# Patient Record
Sex: Female | Born: 1990 | Race: Black or African American | Hispanic: No | Marital: Single | Smoking: Never smoker
Health system: Southern US, Community
[De-identification: ages and names within clinical notes are randomized; demographics above are authoritative.]

## PROBLEM LIST (undated history)

## (undated) ENCOUNTER — Inpatient Hospital Stay (HOSPITAL_COMMUNITY): Payer: Self-pay

## (undated) DIAGNOSIS — M329 Systemic lupus erythematosus, unspecified: Secondary | ICD-10-CM

## (undated) DIAGNOSIS — L509 Urticaria, unspecified: Secondary | ICD-10-CM

## (undated) DIAGNOSIS — T7840XA Allergy, unspecified, initial encounter: Secondary | ICD-10-CM

## (undated) DIAGNOSIS — D649 Anemia, unspecified: Secondary | ICD-10-CM

## (undated) DIAGNOSIS — A6000 Herpesviral infection of urogenital system, unspecified: Secondary | ICD-10-CM

## (undated) DIAGNOSIS — Z8619 Personal history of other infectious and parasitic diseases: Secondary | ICD-10-CM

## (undated) HISTORY — DX: Systemic lupus erythematosus, unspecified: M32.9

## (undated) HISTORY — PX: NO PAST SURGERIES: SHX2092

---
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
## 2011-03-24 ENCOUNTER — Inpatient Hospital Stay (INDEPENDENT_AMBULATORY_CARE_PROVIDER_SITE_OTHER)
Admission: RE | Admit: 2011-03-24 | Discharge: 2011-03-24 | Disposition: A | Discharge status: Home / Self Care | Payer: Medicaid Other | Source: Ambulatory Visit | Attending: Family Medicine | Admitting: Family Medicine

## 2011-03-24 DIAGNOSIS — R197 Diarrhea, unspecified: Secondary | ICD-10-CM

## 2011-09-16 ENCOUNTER — Encounter (HOSPITAL_COMMUNITY): Payer: Self-pay | Admitting: Cardiology

## 2011-09-16 ENCOUNTER — Emergency Department (INDEPENDENT_AMBULATORY_CARE_PROVIDER_SITE_OTHER)
Admission: EM | Admit: 2011-09-16 | Discharge: 2011-09-16 | Disposition: A | Discharge status: Home / Self Care | Payer: Medicaid Other | Source: Home / Self Care | Attending: Emergency Medicine | Admitting: Emergency Medicine

## 2011-09-16 DIAGNOSIS — L508 Other urticaria: Secondary | ICD-10-CM

## 2011-09-16 MED ORDER — DIPHENHYDRAMINE HCL 25 MG PO CAPS
ORAL_CAPSULE | ORAL | Status: AC
  Filled 2011-09-16: qty 1

## 2011-09-16 MED ORDER — FEXOFENADINE HCL 180 MG PO TABS: 180.0000 mg | ORAL_TABLET | Freq: Every day | ORAL | Status: DC

## 2011-09-16 MED ORDER — METHYLPREDNISOLONE ACETATE 80 MG/ML IJ SUSP
INTRAMUSCULAR | Status: AC
  Filled 2011-09-16: qty 1

## 2011-09-16 MED ORDER — CIMETIDINE 400 MG PO TABS: 400.0000 mg | ORAL_TABLET | Freq: Two times a day (BID) | ORAL | Status: DC

## 2011-09-16 MED ORDER — DIPHENHYDRAMINE HCL 25 MG PO CAPS
25.0000 mg | ORAL_CAPSULE | Freq: Four times a day (QID) | ORAL | Status: DC | PRN
  Administered 2011-09-16: 25 mg via ORAL

## 2011-09-16 MED ORDER — METHYLPREDNISOLONE ACETATE 80 MG/ML IJ SUSP
80.0000 mg | Freq: Once | INTRAMUSCULAR | Status: AC
  Administered 2011-09-16: 80 mg via INTRAMUSCULAR

## 2011-09-16 MED ORDER — PREDNISONE 10 MG PO TABS: ORAL_TABLET | ORAL | Status: DC

## 2011-09-16 NOTE — ED Notes (Signed)
Pt reports chest pain for the past week. She hives appear today to face, abdomen and buttocks that lasted several hours today. The have gone away. The chest pain at this time 2/10 is not as strong as it was earlier today 8/10. Describes to be uncomfortable and causes shortness of breath. She has had a similar episode of hives 1 year ago. Denies fever. Tolerating PO intake

## 2011-09-16 NOTE — Discharge Instructions (Signed)
Hives Hives (urticaria) are itchy, red, swollen patches on the skin. They may change size, shape, and location quickly and repeatedly. Hives that occur deeper in the skin can cause swelling of the hands, feet, and face. Hives may be an allergic reaction to something you or your child ate, touched, or put on the skin. Hives can also be a reaction to cold, heat, viral infections, medication, insect bites, or emotional stress. Often the cause is hard to find. Hives can come and go for several days to several weeks. Hives are not contagious. HOME CARE INSTRUCTIONS   If the cause of the hives is known, avoid exposure to that source.   To relieve itching and rash:   Apply cold compresses to the skin or take cool water baths. Do not take or give your child hot baths or showers because the warmth will make the itching worse.   The best medicine for hives is an antihistamine. An antihistamine will not cure hives, but it will reduce their severity. You can use an antihistamine available over the counter. This medicine may make your child sleepy. Teenagers should not drive while using this medicine.   Take or give an antihistamine every 6 hours until the hives are completely gone for 24 hours or as directed.   Your child may have other medications prescribed for itching. Give these as directed by your child's caregiver.   You or your child should wear loose fitting clothing, including undergarments. Skin irritations may make hives worse.   Follow-up as directed by your caregiver.  SEEK MEDICAL CARE IF:   You or your child still have considerable itching after taking the medication (prescribed or purchased over the counter).   Joint swelling or pain occurs.  SEEK IMMEDIATE MEDICAL CARE IF:   You have a fever.   Swollen lips or tongue are noticed.   There is difficulty with breathing, swallowing, or tightness in the throat or chest.   Abdominal pain develops.   Your child starts acting very  sick.  These may be the first signs of a life-threatening allergic reaction. THIS IS AN EMERGENCY. Call 911 for medical help. MAKE SURE YOU:   Understand these instructions.   Will watch your condition.   Will get help right away if you are not doing well or get worse.  Document Released: 05/20/2005 Document Revised: 05/09/2011 Document Reviewed: 01/08/2008 [employer] Patient Information 2012 [employer]. 

## 2011-09-16 NOTE — ED Provider Notes (Signed)
Chief Complaint  Patient presents with  . Urticaria  . Chest Pain    History of Present Illness:   The patient is a 21 year old female who has had a two-year history of intermittent urticarial rash. When this first began she underwent extensive testing but wasn't found to be allergic to anything in particular. She was treated with antihistamines and steroids and got better. Current episode started yesterday. She had itchy lesions on her stomach, around her eyes, her back, and her buttocks. They come and go and another mostly gone except she has a couple spots on her buttocks. They're also associated with chest pain. She's had pain in her upper sternal area and right submammary area. She's had this before. It feels like a throbbing pain. She denies any wheezing, tightness in the chest, or difficulty breathing. No palpitations, dizziness, or syncope. No abdominal pain, nausea, vomiting.  Review of Systems:  Other than noted above, the patient denies any of the following symptoms: Systemic:  No fever, chills, sweats, weight loss, or fatigue. ENT:  No nasal congestion, rhinorrhea, sore throat, swelling of lips, tongue or throat. Resp:  No cough, wheezing, or shortness of breath. Skin:  No rash, itching, nodules, or suspicious lesions.  PMFSH:  Past medical history, family history, social history, meds, and allergies were reviewed.  Physical Exam:   Vital signs:  BP 126/81  Pulse 98  Temp(Src) 98.2 F (36.8 C) (Oral)  Resp 16  SpO2 100%  LMP 08/18/2011 Gen:  Alert, oriented, in no distress. ENT: Pharynx is clear, no intraoral lesions. Lungs: Auscultation without wheezes, rales, rhonchi. Heart: Regular rhythm, no gallops, murmurs, or rubs. Abdomen: Soft, nontender, no organomegaly or mass. Skin:  Her skin was mostly clear. She had a couple of small bumps on her buttocks.  Course in Urgent Care Center:   She was given Benadryl 25 mg by mouth and Depo-Medrol 80 mg IM and tolerated these  medications well without any immediate side effects.  Assessment:  The encounter diagnosis was Chronic urticaria.  Plan:   1.  The following meds were prescribed:   New Prescriptions   CIMETIDINE (TAGAMET) 400 MG TABLET    Take 1 tablet (400 mg total) by mouth 2 (two) times daily.   FEXOFENADINE (ALLEGRA) 180 MG TABLET    Take 1 tablet (180 mg total) by mouth daily.   PREDNISONE (DELTASONE) 10 MG TABLET    Take 4 tabs daily for 4 days, 3 tabs daily for 4 days, 2 tabs daily for 4 days, then 1 tab daily for 4 days.   2.  The patient was instructed in symptomatic care and handouts were given. 3.  The patient was told to return if becoming worse in any way, if no better in 3 or 4 days, and given some red flag symptoms that would indicate earlier return.  Follow up:  The patient was told to follow up she was told to followup with a primary care doctor or dermatologist as soon as possible.      Reuben Likes, MD 09/16/11 [phone]

## 2011-10-20 ENCOUNTER — Encounter (HOSPITAL_COMMUNITY): Payer: Self-pay | Admitting: *Deleted

## 2011-10-20 ENCOUNTER — Emergency Department (INDEPENDENT_AMBULATORY_CARE_PROVIDER_SITE_OTHER)
Admission: EM | Admit: 2011-10-20 | Discharge: 2011-10-20 | Disposition: A | Discharge status: Home / Self Care | Payer: Medicaid Other | Source: Home / Self Care | Attending: Emergency Medicine | Admitting: Emergency Medicine

## 2011-10-20 DIAGNOSIS — J069 Acute upper respiratory infection, unspecified: Secondary | ICD-10-CM

## 2011-10-20 DIAGNOSIS — L508 Other urticaria: Secondary | ICD-10-CM

## 2011-10-20 HISTORY — DX: Allergy, unspecified, initial encounter: T78.40XA

## 2011-10-20 HISTORY — DX: Urticaria, unspecified: L50.9

## 2011-10-20 MED ORDER — METHYLPREDNISOLONE ACETATE 80 MG/ML IJ SUSP
80.0000 mg | Freq: Once | INTRAMUSCULAR | Status: AC
  Administered 2011-10-20: 80 mg via INTRAMUSCULAR

## 2011-10-20 MED ORDER — METHYLPREDNISOLONE ACETATE 80 MG/ML IJ SUSP
INTRAMUSCULAR | Status: AC
  Filled 2011-10-20: qty 1

## 2011-10-20 MED ORDER — FEXOFENADINE HCL 180 MG PO TABS: 180.0000 mg | ORAL_TABLET | Freq: Every day | ORAL | Status: DC

## 2011-10-20 MED ORDER — DIPHENHYDRAMINE HCL 25 MG PO CAPS
ORAL_CAPSULE | ORAL | Status: AC
  Filled 2011-10-20: qty 1

## 2011-10-20 MED ORDER — PREDNISONE 10 MG PO TABS: ORAL_TABLET | ORAL | Status: DC

## 2011-10-20 MED ORDER — CIMETIDINE 400 MG PO TABS: 400.0000 mg | ORAL_TABLET | Freq: Two times a day (BID) | ORAL | Status: DC

## 2011-10-20 MED ORDER — DIPHENHYDRAMINE HCL 25 MG PO CAPS
25.0000 mg | ORAL_CAPSULE | Freq: Once | ORAL | Status: AC
  Administered 2011-10-20: 25 mg via ORAL

## 2011-10-20 NOTE — ED Provider Notes (Signed)
Chief Complaint  Patient presents with  . Rash  . Facial Swelling    History of Present Illness:   The patient is a 21 year old female with chronic urticaria going on for the past 2 years. She had allergy testing when this first began. She was here month ago with the same thing. There is nothing specific triggers off her urticarial symptoms. She's had intermittent symptoms off and on since she was here month ago. The antihistamines that she took seem to help, but then she ran out of them. Current  episode has been going on for the past day. She notes swelling of the face and rash on the upper chest. She notes a throbbing sensation but denies any pain or itching. She's not had any trouble breathing. Over the past 3 days she's had some mild URI symptoms including sore throat, nasal congestion with clear drainage, cough productive green sputum, and fever.  Review of Systems:  Other than noted above, the patient denies any of the following symptoms: Systemic:  No fever, chills, sweats, weight loss, or fatigue. ENT:  No nasal congestion, rhinorrhea, sore throat, swelling of lips, tongue or throat. Resp:  No cough, wheezing, or shortness of breath. Skin:  No rash, itching, nodules, or suspicious lesions.  PMFSH:  Past medical history, family history, social history, meds, and allergies were reviewed.  Physical Exam:   Vital signs:  BP 130/82  Pulse 103  Temp(Src) 100.7 F (38.2 C) (Oral)  Resp 18  SpO2 98%  LMP 09/22/2011 Gen:  Alert, oriented, in no distress. ENT:  Pharynx clear, no intraoral lesions, moist mucous membranes. Lungs:  Clear to auscultation. Skin:  There's not much to see in her face today. Her lips maybe a little bit swelled, she has some bumps on her face but this may be more acne that is any kind of allergic rash. I can see anything on her chest. Her skin is otherwise clear.  Course in Urgent Care Center:   She was given Depo-Medrol 80 mg IM and Benadryl 25 mg by mouth and  tolerated both of these meds well without any immediate side effects.  Assessment:  The primary encounter diagnosis was Chronic urticaria. A diagnosis of Viral upper respiratory infection was also pertinent to this visit.  Plan:   1.  The following meds were prescribed:   New Prescriptions   CIMETIDINE (TAGAMET) 400 MG TABLET    Take 1 tablet (400 mg total) by mouth 2 (two) times daily.   FEXOFENADINE (ALLEGRA) 180 MG TABLET    Take 1 tablet (180 mg total) by mouth daily.   PREDNISONE (DELTASONE) 10 MG TABLET    Take 4 tabs daily for 4 days, 3 tabs daily for 4 days, 2 tabs daily for 4 days, then 1 tab daily for 4 days.   2.  The patient was instructed in symptomatic care and handouts were given. 3.  The patient was told to return if becoming worse in any way, if no better in 3 or 4 days, and given some red flag symptoms that would indicate earlier return.  Follow up:  The patient was told to follow up with Dr. [place] Callas in a week for allergy followup.      Reuben Likes, MD 10/20/11 2015

## 2011-10-20 NOTE — Discharge Instructions (Signed)
Most upper respiratory infections are caused by viruses and do not require antibiotics.  We try to save the antibiotics for when we really need them to avoid resistance.  This does not mean that there is nothing that can be done.  Here are a few hints about things that can be done at home to get over an upper respiratory infection quicker:  Get extra sleep and extra fluids.  Get 7 to 9 hours of sleep per night and 6 to 8 glasses of water a day.  Getting extra sleep keeps the immune system from getting run down.  Most people with an upper respiratory infection are a little dehydrated.  The extra fluids also keep the secretions liquified and easier to deal with.  Also, get extra vitamin C.  4000 mg per day is the recommended dose. For the aches, headache, and fever, acetaminophen or ibuprofen are helpful.  These can be alternated every 4 hours.  People with liver disease should avoid large amounts of acetaminophen, and people with ulcer disease, gastroesophageal reflux, gastritis, congestive heart failure, chronic kidney disease, coronary artery disease and the elderly should avoid ibuprofen. For nasal congestion try Mucinex-D, or if you're having lots of sneezing or copious clear nasal drainage Allegra-D-24 hour.  A Saline nasal spray such as Ocean Spray can also help as can decongestant sprays such as Afrin, but you should not use the decongestant sprays for more than 3 or 4 days since they can be habituating.  If nasal dryness is a problem, Ayr Nasal Gel can help moisturize your nasal passages.  Breath Rite nasal strips can also offer a non-drug alternative treatment to nasal congestion, especially at night. For people with symptoms of sinusitis, sleeping with your head elevated can be helpful.  For sinus pain, moist, hot compresses to the face may provide some relief.  Many people find that inhaling steam as in a shower or from a pot of steaming water can help. For sore throat, zinc containing lozenges such  as Cold-Eze or Zicam are helpful.  Zinc helps to fight infection and has a mild astringent effect that relieves the sore, achey throat.  Hot salt water gargles (8 oz of hot water, 1/2 tsp of table salt, and a pinch of baking soda) can give relief as well as hot beverages such as hot tea. For the cough, old time remedies such as honey or honey and lemon are tried and true.  Over the counter cough syrups such as Delsym 2 tsp every 12 hours can help as well.  It has also been found recently that Aleve can help control a cough.  The dose is 1 to 2 tablets twice daily with food.  This can be combined with Delsym. (Note, if you are taking ibuprofen, you should not take Aleve as well--take one or the other.)  It's important when you have an upper respiratory infection not to pass the infection to others.  This involves being very careful about the following:  Frequent hand washing or use of hand sanitizer, especially after coughing, sneezing, blowing your nose or touching your face, nose or eyes. Do not shake hands or touch anyone and try to avoid touching surfaces that other people use such as doorknobs, shopping carts, telephones and computer keyboards. Use tissues and dispose of them properly in a garbage can or ziplock bag. Cough into your sleeve. Do not let others eat or drink after you.  It's also important to recognize the signs of serious illness and   get evaluated if they occur: Any respiratory infection that lasts more than 7 to 10 days.  Yellow nasal drainage and sputum are not reliable indicators of a bacterial infection, but if they last for more than 1 week, see your doctor. Fever and sore throat can indicate strep. Fever and cough can indicate influenza or pneumonia. Any kind of severe symptom such as difficulty breathing, intractable vomiting, or severe pain should prompt you to see a doctor as soon as possible.   Your body's immune system is really the thing that will get rid of this  infection.  Your immune system is comprised of 2 types of specialized cells called T cells and B cells.  T cells coordinate the array of cells in your body that engulf invading bacteria or viruses while B cells orchestrate the production of antibodies that neutralize infection.  Anything we do or any medications we give you, will just strengthen your immune system or help it clear up the infection quicker.  Here are a few helpful hints to improve your immune system to help overcome this illness or to prevent future infections:  A few vitamins can improve the health of your immune system.  That's why your diet should include plenty of fruits, vegetables, fish, nuts, and whole grains.  Vitamin A and bet-carotene can increase the cells that fight infections (T cells and B cells).  Vitamin A is abundant in dark greens and orange vegetables such as spinach, greens, sweet potatoes, and carrots.  Vitamin B6 contributes to the maturation of white blood cells, the cells that fight disease.  Foods with vitamin B6 include cold cereal and bananas.  Vitamin C is credited with preventing colds because it increases white blood cells and also prevents cellular damage.  Citrus fruits, peaches and green and red bell peppers are all hight in vitamin C.  Vitamin E is an anti-oxidant that encourages the production of natural killer cells which reject foreign invaders and B cells that produce antibodies.  Foods high in vitamin E include wheat germ, nuts and seeds.  Foods high in omega-3 fatty acids found in foods like salmon, tuna and mackerel boost your immune system and help cells to engulf and absorb germs.  Probiotics are good bacteria that increase your T cells.  These can be found in yogurt and are available in supplements such as Culturelle or Align.  Moderate exercise increases the strength of your immune system and your ability to recover from illness.  I suggest 3 to 5 moderate intensity 30 minute workouts per  week.    Sleep is another component of maintaining a strong immune system.  It enables your body to recuperate from the day's activities, stress and work.  My recommendation is to get between 7 and 9 hours of sleep per night.  If you smoke, try to quit completely or at least cut down.  Drink alcohol only in moderation if at all.  No more than 2 drinks daily for men or 1 for women.  Get a flu vaccine early in the fall or if you have not gotten one yet, once this illness has run its course.  If you are over 65, a smoker, or an asthmatic, get a pneumococcal vaccine.  My final recommendation is to maintain a healthy weight.  Excess weight can impair the immune system by interfering with the way the immune system deals with invading viruses or bacteria. Hives Hives (urticaria) are itchy, red, swollen patches on the skin. They may change  size, shape, and location quickly and repeatedly. Hives that occur deeper in the skin can cause swelling of the hands, feet, and face. Hives may be an allergic reaction to something you or your child ate, touched, or put on the skin. Hives can also be a reaction to cold, heat, viral infections, medication, insect bites, or emotional stress. Often the cause is hard to find. Hives can come and go for several days to several weeks. Hives are not contagious. HOME CARE INSTRUCTIONS  If the cause of the hives is known, avoid exposure to that source.  To relieve itching and rash:  Apply cold compresses to the skin or take cool water baths. Do not take or give your child hot baths or showers because the warmth will make the itching worse.  The best medicine for hives is an antihistamine. An antihistamine will not cure hives, but it will reduce their severity. You can use an antihistamine available over the counter. This medicine may make your child sleepy. Teenagers should not drive while using this medicine.  Take or give an antihistamine every 6 hours until the hives are  completely gone for 24 hours or as directed.  Your child may have other medications prescribed for itching. Give these as directed by your child's caregiver.  You or your child should wear loose fitting clothing, including undergarments. Skin irritations may make hives worse.  Follow-up as directed by your caregiver.  SEEK MEDICAL CARE IF:  You or your child still have considerable itching after taking the medication (prescribed or purchased over the counter).  Joint swelling or pain occurs.  SEEK IMMEDIATE MEDICAL CARE IF:  You have a fever.  Swollen lips or tongue are noticed.  There is difficulty with breathing, swallowing, or tightness in the throat or chest.  Abdominal pain develops.  Your child starts acting very sick.  These may be the first signs of a life-threatening allergic reaction. THIS IS AN EMERGENCY. Call 911 for medical help. MAKE SURE YOU:  Understand these instructions.  Will watch your condition.  Will get help right away if you are not doing well or get worse.  Document Released: 05/20/2005 Document Revised: 05/09/2011 Document Reviewed: 01/08/2008 [facility] Patient Information 2012 [place], [place].

## 2011-10-20 NOTE — ED Notes (Signed)
Pt with onset of intermittent rash and swelling face /lips/eyes this morning pt works 7pm to 7am - pt had same type symptoms in April - per pt swelling getting progressively worse -

## 2012-04-30 ENCOUNTER — Encounter (HOSPITAL_COMMUNITY): Payer: Self-pay | Admitting: *Deleted

## 2012-04-30 ENCOUNTER — Emergency Department (HOSPITAL_COMMUNITY)
Admission: EM | Admit: 2012-04-30 | Discharge: 2012-04-30 | Disposition: A | Discharge status: Home / Self Care | Payer: BC Managed Care – PPO | Attending: Emergency Medicine | Admitting: Emergency Medicine

## 2012-04-30 DIAGNOSIS — Z9189 Other specified personal risk factors, not elsewhere classified: Secondary | ICD-10-CM | POA: Insufficient documentation

## 2012-04-30 DIAGNOSIS — G43909 Migraine, unspecified, not intractable, without status migrainosus: Secondary | ICD-10-CM | POA: Insufficient documentation

## 2012-04-30 DIAGNOSIS — Z872 Personal history of diseases of the skin and subcutaneous tissue: Secondary | ICD-10-CM | POA: Insufficient documentation

## 2012-04-30 DIAGNOSIS — Z3202 Encounter for pregnancy test, result negative: Secondary | ICD-10-CM | POA: Insufficient documentation

## 2012-04-30 DIAGNOSIS — R110 Nausea: Secondary | ICD-10-CM | POA: Insufficient documentation

## 2012-04-30 MED ORDER — SODIUM CHLORIDE 0.9 % IV BOLUS (SEPSIS)
1000.0000 mL | Freq: Once | INTRAVENOUS | Status: AC
  Administered 2012-04-30: 1000 mL via INTRAVENOUS

## 2012-04-30 MED ORDER — DEXAMETHASONE SODIUM PHOSPHATE 10 MG/ML IJ SOLN
10.0000 mg | Freq: Once | INTRAMUSCULAR | Status: AC
  Administered 2012-04-30: 10 mg via INTRAVENOUS
  Filled 2012-04-30: qty 1

## 2012-04-30 MED ORDER — DIPHENHYDRAMINE HCL 50 MG/ML IJ SOLN
25.0000 mg | Freq: Once | INTRAMUSCULAR | Status: AC
  Administered 2012-04-30: 25 mg via INTRAVENOUS
  Filled 2012-04-30: qty 1

## 2012-04-30 MED ORDER — KETOROLAC TROMETHAMINE 30 MG/ML IJ SOLN
30.0000 mg | Freq: Once | INTRAMUSCULAR | Status: AC
  Administered 2012-04-30: 30 mg via INTRAVENOUS
  Filled 2012-04-30: qty 1

## 2012-04-30 MED ORDER — METOCLOPRAMIDE HCL 5 MG/ML IJ SOLN
10.0000 mg | Freq: Once | INTRAMUSCULAR | Status: AC
  Administered 2012-04-30: 10 mg via INTRAVENOUS
  Filled 2012-04-30: qty 2

## 2012-04-30 NOTE — ED Notes (Signed)
Headache for one week.  Hives for 3 years .

## 2012-04-30 NOTE — ED Provider Notes (Signed)
History     CSN: [id number]  Arrival date & time 04/30/12  2016   First MD Initiated Contact with Patient 04/30/12 2023      Chief Complaint  Patient presents with  . Headache    (Consider location/radiation/quality/duration/timing/severity/associated sxs/prior treatment) Patient is a 21 y.o. female presenting with headaches.  Headache  This is a new problem. The current episode started more than 2 days ago. The problem occurs every few hours. The problem has not changed since onset.The headache is associated with nothing. The pain is located in the right unilateral region. The quality of the pain is described as throbbing. The pain is at a severity of 7/10. The pain is moderate. The pain does not radiate. Associated symptoms include nausea. Pertinent negatives include no fever, no chest pressure, no near-syncope, no shortness of breath and no vomiting. She has tried nothing for the symptoms.    Past Medical History  Diagnosis Date  . Hives   . Allergic reaction     Past Surgical History  Procedure Date  . Cesarean section 08/07/2001    Family History  Problem Relation Age of Onset  . Hypertension Father     History  Substance Use Topics  . Smoking status: Never Smoker   . Smokeless tobacco: Not on file  . Alcohol Use: Yes     Comment: occas    OB History    Grav Para Term Preterm Abortions TAB SAB Ect Mult Living                  Review of Systems  Constitutional: Negative for fever.  Respiratory: Negative for shortness of breath.   Cardiovascular: Negative for near-syncope.  Gastrointestinal: Positive for nausea. Negative for vomiting.  Neurological: Positive for headaches.  All other systems reviewed and are negative.    Allergies  Hydrocodone  Home Medications  No current outpatient prescriptions on file.  BP 148/85  Pulse 104  Temp 98.3 F (36.8 C) (Oral)  Resp 18  SpO2 100%  Physical Exam  Nursing note and vitals  reviewed. Constitutional: She is oriented to person, place, and time. She appears well-developed and well-nourished. No distress.  HENT:  Head: Normocephalic and atraumatic.  Eyes: EOM are normal. Pupils are equal, round, and reactive to light.  Neck: Normal range of motion. Neck supple.  Cardiovascular: Normal rate and regular rhythm.  Exam reveals no friction rub.   No murmur heard. Pulmonary/Chest: Effort normal and breath sounds normal. No respiratory distress. She has no wheezes. She has no rales.  Abdominal: Soft. She exhibits no distension. There is no tenderness. There is no rebound.  Musculoskeletal: Normal range of motion. She exhibits no edema.  Neurological: She is alert and oriented to person, place, and time. No cranial nerve deficit. She exhibits normal muscle tone. Coordination normal.       Normal gait.  Skin: She is not diaphoretic.    ED Course  Procedures (including critical care time)   Labs Reviewed  POCT PREGNANCY, URINE   No results found.   1. Migraine       MDM   Patient is a 22 year old female who presents with headaches. Started 3 days ago. Right-sided, throbbing. Some associated nausea and photophobia. It is improving. Gradual onset, no thunderclap. Denies fevers. Here patient is alert and oriented, neurologically intact. No gait abnormalities. No cranial nerve abnormalities. Strength and sensation normal in all extremities. Patient seems to be having an uncomplicated migraine. We'll get headache cocktail including Reglan,  Benadryl, Decadron, Toradol. Patient given fluids. On reexam patient is feeling much better. Patient stable for discharge. Given resource guide to help establish a PCP. Discharge home in stable condition.      Elwin Mocha, MD 04/30/12 [id number]

## 2012-05-01 NOTE — ED Provider Notes (Signed)
I saw and evaluated the patient, reviewed the resident's note and I agree with the findings and plan.  Derwood Kaplan, MD 05/01/12 [id number]

## 2012-06-03 NOTE — L&D Delivery Note (Addendum)
Patient was C/C/+2 and pushed for approx 30 minutes with epidural.   Thick meconium, NICU present, double nuchal, NSVD female infant, Apgars 9/9, weight pending.   The patient had bilateral superficial vaginal wall tears, repaired with 3-0.  Amp/gent for chorioamnionitis. Fundus was firm. EBL was expected. Placenta was delivered intact. Vagina was clear.  Baby was vigorous and doing skin to skin with mother.  Philip Aspen

## 2012-09-12 ENCOUNTER — Inpatient Hospital Stay (HOSPITAL_COMMUNITY)
Admission: AD | Admit: 2012-09-12 | Discharge: 2012-09-12 | Disposition: A | Discharge status: Home / Self Care | Payer: No Typology Code available for payment source | Source: Ambulatory Visit | Attending: Obstetrics & Gynecology | Admitting: Obstetrics & Gynecology

## 2012-09-12 ENCOUNTER — Encounter (HOSPITAL_COMMUNITY): Payer: Self-pay | Admitting: Family

## 2012-09-12 DIAGNOSIS — Z3401 Encounter for supervision of normal first pregnancy, first trimester: Secondary | ICD-10-CM

## 2012-09-12 DIAGNOSIS — Z202 Contact with and (suspected) exposure to infections with a predominantly sexual mode of transmission: Secondary | ICD-10-CM | POA: Insufficient documentation

## 2012-09-12 DIAGNOSIS — O99891 Other specified diseases and conditions complicating pregnancy: Secondary | ICD-10-CM | POA: Insufficient documentation

## 2012-09-12 LAB — WET PREP, GENITAL
Trich, Wet Prep: NONE SEEN
Yeast Wet Prep HPF POC: NONE SEEN

## 2012-09-12 LAB — POCT PREGNANCY, URINE: Preg Test, Ur: POSITIVE — AB

## 2012-09-12 MED ORDER — PRENATAL PLUS 27-1 MG PO TABS: 1.0000 | ORAL_TABLET | Freq: Every day | ORAL | Status: DC

## 2012-09-12 NOTE — MAU Provider Note (Signed)
History     CSN: [id number]  Arrival date and time: 09/12/12 1054   None     No chief complaint on file.  HPI Victoria Olson is a 22 y.o. G1P0 female at [redacted]w[redacted]d by LMP who presents w/ report of + HPT yesterday and wants to be checked for STIs.  Denies cramping, vb, vaginal d/c, itching, or irritation. Not taking PNV.  Unsure of who she will see for prenatal care.   OB History   Grav Para Term Preterm Abortions TAB SAB Ect Mult Living   1               Past Medical History  Diagnosis Date  . Hives   . Allergic reaction     Past Surgical History  Procedure Laterality Date  . Cesarean section  08/07/2001    Family History  Problem Relation Age of Onset  . Hypertension Father     History  Substance Use Topics  . Smoking status: Never Smoker   . Smokeless tobacco: Not on file  . Alcohol Use: Yes     Comment: occas    Allergies:  Allergies  Allergen Reactions  . Hydrocodone Hives    Prescriptions prior to admission  Medication Sig Dispense Refill  . [DISCONTINUED] fexofenadine (ALLEGRA) 180 MG tablet Take 180 mg by mouth daily.        Review of Systems  Constitutional: Negative.   HENT: Negative.   Eyes: Negative.   Respiratory: Negative.   Cardiovascular: Negative.   Gastrointestinal: Negative.   Genitourinary: Negative.   Musculoskeletal: Negative.   Skin: Negative.   Neurological: Negative.   Endo/Heme/Allergies: Negative.   Psychiatric/Behavioral: Negative.    Physical Exam   Blood pressure 130/80, pulse 96, temperature 98.6 F (37 C), temperature source Oral, resp. rate 16, last menstrual period 08/07/2012.  Physical Exam  Constitutional: She is oriented to person, place, and time. She appears well-developed and well-nourished.  HENT:  Head: Normocephalic.  Neck: Normal range of motion.  Cardiovascular: Normal rate.   Respiratory: Effort normal.  GI: Soft.  Genitourinary: Vagina normal.  Spec exam: no discharge noted, no odor, cervix  visualized- appears normal, gc/ct and wet prep obtained  Musculoskeletal: Normal range of motion.  Neurological: She is alert and oriented to person, place, and time.  Skin: Skin is warm and dry.  Psychiatric: She has a normal mood and affect. Her behavior is normal. Judgment and thought content normal.    MAU Course  Procedures  Urine preg test + Spec exam w/ gc/ct and wet prep  Results for orders placed during the hospital encounter of 09/12/12 (from the past 24 hour(s))  POCT PREGNANCY, URINE     Status: Abnormal   Collection Time    09/12/12 11:35 AM      Result Value Range   Preg Test, Ur POSITIVE (*) NEGATIVE  WET PREP, GENITAL     Status: Abnormal   Collection Time    09/12/12 12:10 PM      Result Value Range   Yeast Wet Prep HPF POC NONE SEEN  NONE SEEN   Trich, Wet Prep NONE SEEN  NONE SEEN   Clue Cells Wet Prep HPF POC MODERATE (*) NONE SEEN   WBC, Wet Prep HPF POC MODERATE (*) NONE SEEN    Assessment and Plan  A:  [redacted]w[redacted]d pregnancy  STI check  Mod clue cells w/o other s/s- will not treat   P:  D/C home  Pregnancy verification form given by RN  List of OB providers given- begin care ASAP  Rx Prenatal Plus    [name] 09/12/2012, 12:21 PM

## 2012-09-12 NOTE — MAU Note (Addendum)
Patient presents to MAU after positive HPT yesterday. Denies pain or vaginal bleeding. Reports possible STI exposure; increased white vaginal discharge x 1 month; denies vaginal itching/burning/lesions.

## 2012-09-14 LAB — GC/CHLAMYDIA PROBE AMP: CT Probe RNA: NEGATIVE

## 2012-09-14 NOTE — MAU Provider Note (Signed)
Attestation of Attending Supervision of Advanced Practitioner (CNM/NP): Evaluation and management procedures were performed by the Advanced Practitioner under my supervision and collaboration. I have reviewed the Advanced Practitioner's note and chart, and I agree with the management and plan.  Revia Nghiem H. 9:54 PM

## 2012-10-30 LAB — OB RESULTS CONSOLE HEPATITIS B SURFACE ANTIGEN: Hepatitis B Surface Ag: NEGATIVE

## 2012-10-30 LAB — OB RESULTS CONSOLE GC/CHLAMYDIA: Gonorrhea: NEGATIVE

## 2012-10-30 LAB — OB RESULTS CONSOLE ABO/RH: RH Type: POSITIVE

## 2012-10-30 LAB — OB RESULTS CONSOLE HIV ANTIBODY (ROUTINE TESTING): HIV: NONREACTIVE

## 2012-10-30 LAB — OB RESULTS CONSOLE RUBELLA ANTIBODY, IGM: Rubella: IMMUNE

## 2012-11-24 ENCOUNTER — Emergency Department (HOSPITAL_COMMUNITY)
Admission: EM | Admit: 2012-11-24 | Discharge: 2012-11-24 | Discharge status: Against Medical Advice | Payer: Medicaid Other | Attending: Emergency Medicine | Admitting: Emergency Medicine

## 2012-11-24 ENCOUNTER — Encounter (HOSPITAL_COMMUNITY): Payer: Self-pay | Admitting: Emergency Medicine

## 2012-11-24 DIAGNOSIS — O9989 Other specified diseases and conditions complicating pregnancy, childbirth and the puerperium: Secondary | ICD-10-CM | POA: Insufficient documentation

## 2012-11-24 DIAGNOSIS — R300 Dysuria: Secondary | ICD-10-CM | POA: Insufficient documentation

## 2012-11-24 DIAGNOSIS — N949 Unspecified condition associated with female genital organs and menstrual cycle: Secondary | ICD-10-CM | POA: Insufficient documentation

## 2012-11-24 NOTE — ED Notes (Signed)
Pts name called to go back to a room no answer

## 2012-11-24 NOTE — ED Notes (Signed)
PT. REPORTS DYSURIA WITH VAGINAL PAIN / DISCOMFORT FOR SEVERAL DAYS , DENIES HEMATURIA , PT. STATED SHE IS [redacted] WEEKS PREGNANT G1P0 / DENIES SPOTTING / PRENATAL VISITS AT [facility].

## 2012-11-26 ENCOUNTER — Emergency Department (INDEPENDENT_AMBULATORY_CARE_PROVIDER_SITE_OTHER)
Admission: EM | Admit: 2012-11-26 | Discharge: 2012-11-26 | Disposition: A | Discharge status: Home / Self Care | Payer: Medicaid Other | Source: Home / Self Care | Attending: Emergency Medicine | Admitting: Emergency Medicine

## 2012-11-26 ENCOUNTER — Encounter (HOSPITAL_COMMUNITY): Payer: Self-pay | Admitting: *Deleted

## 2012-11-26 ENCOUNTER — Other Ambulatory Visit (HOSPITAL_COMMUNITY)
Admission: RE | Admit: 2012-11-26 | Discharge: 2012-11-26 | Disposition: A | Discharge status: Home / Self Care | Payer: Medicaid Other | Source: Ambulatory Visit | Attending: Emergency Medicine | Admitting: Emergency Medicine

## 2012-11-26 DIAGNOSIS — N760 Acute vaginitis: Secondary | ICD-10-CM | POA: Insufficient documentation

## 2012-11-26 DIAGNOSIS — Z113 Encounter for screening for infections with a predominantly sexual mode of transmission: Secondary | ICD-10-CM | POA: Insufficient documentation

## 2012-11-26 DIAGNOSIS — N949 Unspecified condition associated with female genital organs and menstrual cycle: Secondary | ICD-10-CM

## 2012-11-26 DIAGNOSIS — N9489 Other specified conditions associated with female genital organs and menstrual cycle: Secondary | ICD-10-CM

## 2012-11-26 MED ORDER — ACETAMINOPHEN 325 MG PO TABS
650.0000 mg | ORAL_TABLET | Freq: Once | ORAL | Status: AC
  Administered 2012-11-26: 650 mg via ORAL

## 2012-11-26 MED ORDER — ACETAMINOPHEN 325 MG PO TABS
ORAL_TABLET | ORAL | Status: AC
  Filled 2012-11-26: qty 2

## 2012-11-26 MED ORDER — ACYCLOVIR 400 MG PO TABS: 400.0000 mg | ORAL_TABLET | Freq: Three times a day (TID) | ORAL | Status: AC

## 2012-11-26 NOTE — ED Notes (Signed)
Pt   Is  Pregnant       -      She        Reports        She  Was  Seen  9  Days  Ago     By      [employer]          As  Well  As        sev  Days  Ago  As  Well       Pt  Also  Reports      Sores  And  Burning  In   Genitalia  Area        She  Reports  The    Pain is  Worse  On   Urination      Pt  denys  Any   Vaginal bleeding  Or  Any  Low  abd  Pain

## 2012-11-26 NOTE — ED Provider Notes (Signed)
History    CSN: [id number] Arrival date & time 11/26/12  1032  First MD Initiated Contact with Patient 11/26/12 1125     Chief Complaint  Patient presents with  . Vaginal Discharge   (Consider location/radiation/quality/duration/timing/severity/associated sxs/prior Treatment) HPI Comments: Patient presents urgent care describing that she has seen her gynecologist 2 times with ongoing vaginal discharge and discomfort. She was diagnosed with a yeast infection and she's been using this prescribed a cream for the last 3 days. Which is not helping,  She now is extremely so in points to her left labia majora.  Patient denies any vaginal bleeding, pelvic pain, or urinary symptoms such as increased frequency pressure or burning with urination when she is asked.  Patient is a 22 y.o. female presenting with vaginal discharge. The history is provided by the patient.  Vaginal Discharge Quality:  Mucoid and milky Severity:  Moderate Onset quality:  Gradual Timing:  Constant Progression:  Worsening Context: at rest, during pregnancy, recent antibiotic use and spontaneously   Context: not after urination, not during bowel movement and not during intercourse   Relieved by:  Prescription medications Associated symptoms: dysuria, genital lesions and vaginal itching   Associated symptoms: no abdominal pain, no fever, no nausea, no urinary incontinence and no vomiting   Risk factors: STI exposure and unprotected sex   Risk factors: no terminated pregnancy    Past Medical History  Diagnosis Date  . Hives   . Allergic reaction    Past Surgical History  Procedure Laterality Date  . Cesarean section  08/07/2001   Family History  Problem Relation Age of Onset  . Hypertension Father    History  Substance Use Topics  . Smoking status: Never Smoker   . Smokeless tobacco: Not on file  . Alcohol Use: No     Comment: occas   OB History   Grav Para Term Preterm Abortions TAB SAB Ect Mult Living    1              Review of Systems  Constitutional: Negative for fever and activity change.  Gastrointestinal: Negative for nausea, vomiting and abdominal pain.  Genitourinary: Positive for dysuria, vaginal discharge and genital sores. Negative for bladder incontinence, urgency, frequency, flank pain, decreased urine volume, vaginal bleeding, menstrual problem and pelvic pain.    Allergies  Hydrocodone  Home Medications   Current Outpatient Rx  Name  Route  Sig  Dispense  Refill  . terconazole (TERAZOL 7) 0.4 % vaginal cream   Vaginal   Place 1 applicator vaginally at bedtime.         Marland Kitchen acyclovir (ZOVIRAX) 400 MG tablet   Oral   Take 1 tablet (400 mg total) by mouth 3 (three) times daily.   21 tablet   0   . prenatal vitamin w/FE, FA (PRENATAL 1 + 1) 27-1 MG TABS   Oral   Take 1 tablet by mouth daily at 12 noon.   30 each   12    BP 114/84  Pulse 100  Temp(Src) 98.6 F (37 C) (Oral)  Resp 16  SpO2 100%  LMP 08/07/2012 Physical Exam  Nursing note and vitals reviewed. Constitutional: She appears well-developed.  Neck: Neck supple.  Abdominal: Soft. There is no tenderness. Hernia confirmed negative in the right inguinal area and confirmed negative in the left inguinal area.  Genitourinary: Guaiac positive stool.    There is no rash, tenderness, lesion or injury on the right labia. There is rash,  tenderness and lesion on the left labia. There is erythema and tenderness around the vagina. No bleeding around the vagina. No signs of injury around the vagina. Vaginal discharge found.  Skin: Rash noted.    ED Course  Procedures (including critical care time) Labs Reviewed  HERPES SIMPLEX VIRUS CULTURE  URINE CYTOLOGY ANCILLARY ONLY   No results found. 1. Genital lesion, female   2. 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 Have had a long discussion with patient have explained to patient that we won't have this viral  culture results for the next 4-5 days. Have recommended her to start with empirical treatment and to call her obstetrician to alert them of this possibility as she might need of prophylactic prevented treatment for the rest of her pregnancy. We will contact patient if positive results. We have also obtained samples for a wet prep in the urine sample for chlamydia and gonorrhea and trichomonas a screening. As patient refuses a speculum exam as she was very tender in her vaginal introitus.   New Prescriptions   ACYCLOVIR (ZOVIRAX) 400 MG TABLET    Take 1 tablet (400 mg total) by mouth 3 (three) times daily.   MDM    Jimmie Molly, MD 11/26/12 1227

## 2012-12-02 ENCOUNTER — Telehealth (HOSPITAL_COMMUNITY): Payer: Self-pay | Admitting: *Deleted

## 2012-12-02 MED ORDER — METRONIDAZOLE 500 MG PO TABS: 500.0000 mg | ORAL_TABLET | Freq: Two times a day (BID) | ORAL | Status: DC

## 2012-12-02 NOTE — ED Notes (Signed)
GC/Chlamydia/Trich neg., Candida neg, Gardnerella, Prevotella bivia, and Mobiluncus Mulierus pos., Herpes culture pending.  7/1 Message sent to Dr. Ladon Applebaum. 7/2 Flagyl E-prescribed to pt.'s pharmacy.  I called pt.   Pt. verified x 2 and given results.  Pt. told she needs Flagyl for bacterial vaginosis and it is at the [facility] on [address]   Pt. instructed to no alcohol while taking this medication.  Pt. asked if there was anything else she can take because it makes her nauseated. I told her, that is a side effect of this medication and to try and take it with food, if she vomits the medication to call back. I told pt. the Herpes test is pending and I would only call back if it is pos. Pt. voiced understanding. Vassie Moselle 12/02/2012

## 2012-12-03 ENCOUNTER — Encounter (HOSPITAL_COMMUNITY): Payer: Self-pay | Admitting: *Deleted

## 2012-12-03 ENCOUNTER — Inpatient Hospital Stay (HOSPITAL_COMMUNITY)
Admission: AD | Admit: 2012-12-03 | Discharge: 2012-12-03 | Disposition: A | Discharge status: Home / Self Care | Payer: Medicaid Other | Source: Ambulatory Visit | Attending: Obstetrics and Gynecology | Admitting: Obstetrics and Gynecology

## 2012-12-03 DIAGNOSIS — O2390 Unspecified genitourinary tract infection in pregnancy, unspecified trimester: Secondary | ICD-10-CM | POA: Insufficient documentation

## 2012-12-03 DIAGNOSIS — O219 Vomiting of pregnancy, unspecified: Secondary | ICD-10-CM

## 2012-12-03 DIAGNOSIS — B9689 Other specified bacterial agents as the cause of diseases classified elsewhere: Secondary | ICD-10-CM | POA: Insufficient documentation

## 2012-12-03 DIAGNOSIS — R109 Unspecified abdominal pain: Secondary | ICD-10-CM | POA: Insufficient documentation

## 2012-12-03 DIAGNOSIS — O210 Mild hyperemesis gravidarum: Secondary | ICD-10-CM | POA: Insufficient documentation

## 2012-12-03 DIAGNOSIS — N760 Acute vaginitis: Secondary | ICD-10-CM | POA: Insufficient documentation

## 2012-12-03 DIAGNOSIS — A499 Bacterial infection, unspecified: Secondary | ICD-10-CM | POA: Insufficient documentation

## 2012-12-03 LAB — COMPREHENSIVE METABOLIC PANEL
Albumin: 2.8 g/dL — ABNORMAL LOW (ref 3.5–5.2)
Alkaline Phosphatase: 72 U/L (ref 39–117)
BUN: 4 mg/dL — ABNORMAL LOW (ref 6–23)
Chloride: 102 mEq/L (ref 96–112)
GFR calc Af Amer: >90 mL/min (ref >90)
Glucose, Bld: 90 mg/dL (ref 70–99)
Potassium: 4 mEq/L (ref 3.5–5.1)
Total Bilirubin: 0.2 mg/dL — ABNORMAL LOW (ref 0.3–1.2)

## 2012-12-03 LAB — CBC
HCT: 35.1 % — ABNORMAL LOW (ref 36.0–46.0)
Hemoglobin: 11.9 g/dL — ABNORMAL LOW (ref 12.0–15.0)
RBC: 4.26 MIL/uL (ref 3.87–5.11)
WBC: 13.1 10*3/uL — ABNORMAL HIGH (ref 4.0–10.5)

## 2012-12-03 LAB — URINALYSIS, ROUTINE W REFLEX MICROSCOPIC
Glucose, UA: NEGATIVE mg/dL
Leukocytes, UA: NEGATIVE
pH: 7 (ref 5.0–8.0)

## 2012-12-03 MED ORDER — LACTATED RINGERS IV BOLUS (SEPSIS)
1000.0000 mL | Freq: Once | INTRAVENOUS | Status: AC
  Administered 2012-12-03: 1000 mL via INTRAVENOUS

## 2012-12-03 MED ORDER — ONDANSETRON HCL 4 MG/2ML IJ SOLN
4.0000 mg | Freq: Once | INTRAMUSCULAR | Status: AC
  Administered 2012-12-03: 4 mg via INTRAVENOUS
  Filled 2012-12-03: qty 2

## 2012-12-03 MED ORDER — PROMETHAZINE HCL 12.5 MG PO TABS: 12.5000 mg | ORAL_TABLET | Freq: Four times a day (QID) | ORAL | Status: DC | PRN

## 2012-12-03 MED ORDER — CLINDAMYCIN HCL 300 MG PO CAPS: 300.0000 mg | ORAL_CAPSULE | Freq: Two times a day (BID) | ORAL | Status: DC

## 2012-12-03 NOTE — MAU Provider Note (Signed)
History     CSN: [id number]  Arrival date and time: 12/03/12 1303   None     Chief Complaint  Patient presents with  . Emesis During Pregnancy  . Abdominal Pain   HPI Victoria Olson is a 22 y.o. G1P0 female @ [redacted]w[redacted]d who presents w/ report of lower abdominal pain today, nausea throughout pregnancy- increased today w/ emesis x 3-4 today- last on way to hospital.  Denies diarrhea, fever/chills, UTI s/s. Last liquids/solids that stayed down was last night, hasn't tried eating/drinking today. 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 Was also dx w/ BV, but hasn't taken metronidazole b/c she states it makes her sick- requests different med. Also states OTC prenatal vitamins makes her sick so she stopped taking. Next appt @ [facility] tues.   OB History   Grav Para Term Preterm Abortions TAB SAB Ect Mult Living   1               Past Medical History  Diagnosis Date  . Hives   . Allergic reaction   . [id number](478.2)     Past Surgical History  Procedure Laterality Date  . No past surgeries      Family History  Problem Relation Age of Onset  . Hypertension Father     History  Substance Use Topics  . Smoking status: Never Smoker   . Smokeless tobacco: Not on file  . Alcohol Use: No     Comment: occas    Allergies:  Allergies  Allergen Reactions  . Hydrocodone Hives    Prescriptions prior to admission  Medication Sig Dispense Refill  . acyclovir (ZOVIRAX) 400 MG tablet Take 1 tablet (400 mg total) by mouth 3 (three) times daily.  21 tablet  0  . terconazole (TERAZOL 7) 0.4 % vaginal cream Place 1 applicator vaginally at bedtime.      . metroNIDAZOLE (FLAGYL) 500 MG tablet Take 1 tablet (500 mg total) by mouth 2 (two) times daily.  14 tablet  0    Review of Systems  Constitutional: Negative.   HENT: Negative.   Eyes: Negative.   Respiratory:  Negative.   Cardiovascular: Negative.   Gastrointestinal: Positive for nausea, vomiting and abdominal pain. Negative for diarrhea.  Genitourinary: Negative.   Musculoskeletal: Negative.   Skin: Negative.   Neurological: Negative.   Endo/Heme/Allergies: Negative.   Psychiatric/Behavioral: Negative.    Physical Exam   Blood pressure 127/76, pulse 97, temperature 98.8 F (37.1 C), temperature source Oral, resp. rate 16, height 5\' 3"  (1.6 m), weight 87.907 kg (193 lb 12.8 oz), last menstrual period 08/07/2012, SpO2 100.00%.  Physical Exam  Constitutional: She is oriented to person, place, and time. She appears well-developed and well-nourished.  HENT:  Head: Normocephalic.  Neck: Normal range of motion.  Cardiovascular: Normal rate.   Respiratory: Effort normal.  GI: Soft. She exhibits no distension. There is no tenderness. There is no rebound and no guarding.  gravid  Genitourinary:  SVE: LTC, high  Musculoskeletal: Normal range of motion.  Neurological: She is alert and oriented to person, place, and time.  Skin: Skin is warm and dry.  Psychiatric: She has a normal mood and affect. Her behavior is normal. Judgment and thought content normal.   FHR via doppler: 154  MAU Course  Procedures  FHR via doppler SVE CBC, CMP, UA 1L LR bolus Zofran 4mg  IV  x 1 1615: feeling much better, eating crackers and drinking gingerale w/o any further n/v. Would like work note for today 1630: discussed w/ dr. Ellyn Hack, OK for d/c, to f/u as on tues  Results for orders placed during the hospital encounter of 12/03/12 (from the past 24 hour(s))  URINALYSIS, ROUTINE W REFLEX MICROSCOPIC     Status: Abnormal   Collection Time    12/03/12  1:30 PM      Result Value Range   Color, Urine YELLOW  YELLOW   APPearance CLEAR  CLEAR   Specific Gravity, Urine 1.025  1.005 - 1.030   pH 7.0  5.0 - 8.0   Glucose, UA NEGATIVE  NEGATIVE mg/dL   Hgb urine dipstick NEGATIVE  NEGATIVE   Bilirubin Urine  NEGATIVE  NEGATIVE   Ketones, ur >80 (*) NEGATIVE mg/dL   Protein, ur NEGATIVE  NEGATIVE mg/dL   Urobilinogen, UA 0.2  0.0 - 1.0 mg/dL   Nitrite NEGATIVE  NEGATIVE   Leukocytes, UA NEGATIVE  NEGATIVE  CBC     Status: Abnormal   Collection Time    12/03/12  2:25 PM      Result Value Range   WBC 13.1 (*) 4.0 - 10.5 K/uL   RBC 4.26  3.87 - 5.11 MIL/uL   Hemoglobin 11.9 (*) 12.0 - 15.0 g/dL   HCT 16.1 (*) 09.6 - 04.5 %   MCV 82.4  78.0 - 100.0 fL   MCH 27.9  26.0 - 34.0 pg   MCHC 33.9  30.0 - 36.0 g/dL   RDW 40.9  81.1 - 91.4 %   Platelets 232  150 - 400 K/uL  COMPREHENSIVE METABOLIC PANEL     Status: Abnormal   Collection Time    12/03/12  2:25 PM      Result Value Range   Sodium 135  135 - 145 mEq/L   Potassium 4.0  3.5 - 5.1 mEq/L   Chloride 102  96 - 112 mEq/L   CO2 25  19 - 32 mEq/L   Glucose, Bld 90  70 - 99 mg/dL   BUN 4 (*) 6 - 23 mg/dL   Creatinine, Ser 7.82  0.50 - 1.10 mg/dL   Calcium 9.3  8.4 - 95.6 mg/dL   Total Protein 6.9  6.0 - 8.3 g/dL   Albumin 2.8 (*) 3.5 - 5.2 g/dL   AST 12  0 - 37 U/L   ALT 11  0 - 35 U/L   Alkaline Phosphatase 72  39 - 117 U/L   Total Bilirubin 0.2 (*) 0.3 - 1.2 mg/dL   GFR calc non Af Amer >90  >90 mL/min   GFR calc Af Amer >90  >90 mL/min     Assessment and Plan  A:  [redacted]w[redacted]d SIUP  G1P0    N/V pregnancy  Abdominal cramping  Recently dx BV w/o tx as of yet  Not taking PNV  P:  D/C home  Rx Clindamycin 300mg  BID x 7d for BV  Rx Phenergan 12.5mg  po q 6hr prn n/v, #30, 0RF  Given info on n/v relief measures  Has rx for prenatal vits that she never got filled- to fill and begin taking. Or flinstone gummies if Rx PNV cause n/v  To keep appt as scheduled w/ [facility] on tues    [name] 12/03/2012, 2:10 PM

## 2012-12-03 NOTE — MAU Note (Signed)
Patient states she has been having vomiting and lower abdominal pain today. Denies bleeding but has just finished medication for yeast and has a slight discharge. Was called medication for BV but states she cannot take it because of the nausea.

## 2012-12-08 ENCOUNTER — Telehealth (HOSPITAL_COMMUNITY): Payer: Self-pay | Admitting: *Deleted

## 2012-12-08 NOTE — ED Notes (Signed)
Herpes still showing pending.  I called [facility] on 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  They will fax it to me and resend it to pt.'s record.  I called pt. Pt. verified x 2 and given result.  Pt. told she was adequately treated with Acyclovir. Pt. said she has finished the medication and it is healed.  Pt. instructed to notify her partner. You can pass the virus even when you don't have an outbreak, so always practice safe sex. Get treated for each outbreak with Acyclovir or Valtrex. You may want to get an OB-GYN doctor or PCP, who can call in a prescription for you when you have an outbreak. Pt. said she has already informed her OB doctor as instructed by Dr. Ladon Applebaum, but will only have this doctor while pregnant. I told her about the [employer] [facility]. Vassie Moselle 12/08/2012

## 2013-01-21 ENCOUNTER — Other Ambulatory Visit: Payer: Self-pay

## 2013-04-13 LAB — OB RESULTS CONSOLE GBS: GBS: NEGATIVE

## 2013-04-27 ENCOUNTER — Encounter (HOSPITAL_COMMUNITY): Payer: Self-pay | Admitting: *Deleted

## 2013-04-27 ENCOUNTER — Inpatient Hospital Stay (HOSPITAL_COMMUNITY)
Admission: AD | Admit: 2013-04-27 | Discharge: 2013-04-27 | Disposition: A | Discharge status: Home / Self Care | Payer: Medicaid Other | Source: Ambulatory Visit | Attending: Obstetrics and Gynecology | Admitting: Obstetrics and Gynecology

## 2013-04-27 DIAGNOSIS — O368190 Decreased fetal movements, unspecified trimester, not applicable or unspecified: Secondary | ICD-10-CM | POA: Insufficient documentation

## 2013-04-27 NOTE — MAU Note (Signed)
Baby has not been moving as much lately, sent from routine appt for NST

## 2013-05-11 ENCOUNTER — Inpatient Hospital Stay (HOSPITAL_COMMUNITY)
Admission: AD | Admit: 2013-05-11 | Discharge: 2013-05-11 | Disposition: A | Discharge status: Home / Self Care | Payer: Medicaid Other | Source: Ambulatory Visit | Attending: Obstetrics and Gynecology | Admitting: Obstetrics and Gynecology

## 2013-05-11 ENCOUNTER — Encounter (HOSPITAL_COMMUNITY): Payer: Self-pay | Admitting: *Deleted

## 2013-05-11 DIAGNOSIS — N390 Urinary tract infection, site not specified: Secondary | ICD-10-CM | POA: Insufficient documentation

## 2013-05-11 DIAGNOSIS — O479 False labor, unspecified: Secondary | ICD-10-CM | POA: Insufficient documentation

## 2013-05-11 DIAGNOSIS — O133 Gestational [pregnancy-induced] hypertension without significant proteinuria, third trimester: Secondary | ICD-10-CM

## 2013-05-11 DIAGNOSIS — R109 Unspecified abdominal pain: Secondary | ICD-10-CM | POA: Insufficient documentation

## 2013-05-11 DIAGNOSIS — O2390 Unspecified genitourinary tract infection in pregnancy, unspecified trimester: Secondary | ICD-10-CM | POA: Insufficient documentation

## 2013-05-11 DIAGNOSIS — N949 Unspecified condition associated with female genital organs and menstrual cycle: Secondary | ICD-10-CM | POA: Insufficient documentation

## 2013-05-11 DIAGNOSIS — R030 Elevated blood-pressure reading, without diagnosis of hypertension: Secondary | ICD-10-CM | POA: Insufficient documentation

## 2013-05-11 DIAGNOSIS — O139 Gestational [pregnancy-induced] hypertension without significant proteinuria, unspecified trimester: Secondary | ICD-10-CM

## 2013-05-11 DIAGNOSIS — N898 Other specified noninflammatory disorders of vagina: Secondary | ICD-10-CM | POA: Insufficient documentation

## 2013-05-11 LAB — CBC
Hemoglobin: 11 g/dL — ABNORMAL LOW (ref 12.0–15.0)
MCH: 28.3 pg (ref 26.0–34.0)
MCHC: 34 g/dL (ref 30.0–36.0)
MCV: 83.3 fL (ref 78.0–100.0)

## 2013-05-11 LAB — COMPREHENSIVE METABOLIC PANEL
Alkaline Phosphatase: 151 U/L — ABNORMAL HIGH (ref 39–117)
BUN: 7 mg/dL (ref 6–23)
Calcium: 8.8 mg/dL (ref 8.4–10.5)
Creatinine, Ser: 0.61 mg/dL (ref 0.50–1.10)
GFR calc Af Amer: >90 mL/min (ref >90)
Glucose, Bld: 81 mg/dL (ref 70–99)
Potassium: 4.1 mEq/L (ref 3.5–5.1)
Total Protein: 6.5 g/dL (ref 6.0–8.3)

## 2013-05-11 LAB — URINE MICROSCOPIC-ADD ON

## 2013-05-11 LAB — URINALYSIS, ROUTINE W REFLEX MICROSCOPIC
Bilirubin Urine: NEGATIVE
Ketones, ur: NEGATIVE mg/dL
Nitrite: NEGATIVE
pH: 7 (ref 5.0–8.0)

## 2013-05-11 LAB — LACTATE DEHYDROGENASE: LDH: 166 U/L (ref 94–250)

## 2013-05-11 LAB — URIC ACID: Uric Acid, Serum: 5.4 mg/dL (ref 2.4–7.0)

## 2013-05-11 MED ORDER — NITROFURANTOIN MONOHYD MACRO 100 MG PO CAPS: 100.0000 mg | ORAL_CAPSULE | Freq: Two times a day (BID) | ORAL | Status: DC

## 2013-05-11 NOTE — Progress Notes (Signed)
Report given to J. Rasch, NP, MAU provider due to constant low abd pain, thick yellow vaginal discharge, elevated BP. Occas. mild UC's.

## 2013-05-11 NOTE — MAU Note (Signed)
States she feels aching and cramping pain in very low abdomen since 0500. States mostly constant. Heavy yellow vaginal D/C.

## 2013-05-11 NOTE — MAU Provider Note (Signed)
History     CSN: [id number]  Arrival date and time: 05/11/13 6962   First Provider Initiated Contact with Patient 05/11/13 1012      Chief Complaint  Patient presents with  . Pelvic Pain   HPI  [name] is a 22 y.o. female G1P0 at [redacted]w[redacted]d who presents with lower pelvic pain that comes and goes. She is also complaining of thick, yellow discharge that started a few days ago. 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 She reports good fetal movement, denies LOF, vaginal bleeding, vaginal itching/burning, h/a, dizziness, n/v, or fever/chills.    OB History   Grav Para Term Preterm Abortions TAB SAB Ect Mult Living   1               Past Medical History  Diagnosis Date  . Hives   . Allergic reaction   . Headache(784.0)   . Herpes   . Trichomonas     Past Surgical History  Procedure Laterality Date  . No past surgeries      Family History  Problem Relation Age of Onset  . Hypertension Father   . Cancer Mother     uterine  . Diabetes Maternal Uncle   . Hypertension Maternal Grandfather   . Stroke Maternal Grandfather   . Hypertension Paternal Grandfather   . Heart disease Paternal Grandfather   . Stroke Paternal Grandfather   . Hearing loss Neg Hx     History  Substance Use Topics  . Smoking status: Never Smoker   . Smokeless tobacco: Never Used  . Alcohol Use: No     Comment: occas    Allergies:  Allergies  Allergen Reactions  . Hydrocodone Hives    Prescriptions prior to admission  Medication Sig Dispense Refill  . valACYclovir (VALTREX) 1000 MG tablet Take 1,000 mg by mouth daily.       Results for orders placed during the hospital encounter of 05/11/13 (from the past 24 hour(s))  URINALYSIS, ROUTINE W REFLEX MICROSCOPIC     Status: Abnormal   Collection Time    05/11/13 10:06 AM      Result Value Range   Color, Urine YELLOW  YELLOW   APPearance CLEAR  CLEAR   Specific Gravity, Urine  1.020  1.005 - 1.030   pH 7.0  5.0 - 8.0   Glucose, UA NEGATIVE  NEGATIVE mg/dL   Hgb urine dipstick TRACE (*) NEGATIVE   Bilirubin Urine NEGATIVE  NEGATIVE   Ketones, ur NEGATIVE  NEGATIVE mg/dL   Protein, ur NEGATIVE  NEGATIVE mg/dL   Urobilinogen, UA 0.2  0.0 - 1.0 mg/dL   Nitrite NEGATIVE  NEGATIVE   Leukocytes, UA MODERATE (*) NEGATIVE  URINE MICROSCOPIC-ADD ON     Status: Abnormal   Collection Time    05/11/13 10:06 AM      Result Value Range   Squamous Epithelial / LPF FEW (*) RARE   WBC, UA 3-6  <3 WBC/hpf   RBC / HPF 3-6  <3 RBC/hpf   Bacteria, UA RARE  RARE   Urine-Other MUCOUS PRESENT    CBC     Status: Abnormal   Collection Time    05/11/13 10:30 AM      Result Value Range   WBC 16.1 (*) 4.0 - 10.5 K/uL   RBC 3.89  3.87 - 5.11 MIL/uL   Hemoglobin 11.0 (*) 12.0 - 15.0 g/dL   HCT 95.2 (*)  36.0 - 46.0 %   MCV 83.3  78.0 - 100.0 fL   MCH 28.3  26.0 - 34.0 pg   MCHC 34.0  30.0 - 36.0 g/dL   RDW 16.1  09.6 - 04.5 %   Platelets 195  150 - 400 K/uL  COMPREHENSIVE METABOLIC PANEL     Status: Abnormal (Preliminary result)   Collection Time    05/11/13 10:30 AM      Result Value Range   Sodium 137  135 - 145 mEq/L   Potassium 4.1  3.5 - 5.1 mEq/L   Chloride 105  96 - 112 mEq/L   CO2 22  19 - 32 mEq/L   Glucose, Bld 81  70 - 99 mg/dL   BUN 7  6 - 23 mg/dL   Creatinine, Ser 4.09  0.50 - 1.10 mg/dL   Calcium 8.8  8.4 - 81.1 mg/dL   Total Protein 6.5  6.0 - 8.3 g/dL   Albumin 2.2 (*) 3.5 - 5.2 g/dL   AST 12  0 - 37 U/L   ALT 7  0 - 35 U/L   Alkaline Phosphatase 151 (*) 39 - 117 U/L   Total Bilirubin PENDING  0.3 - 1.2 mg/dL   GFR calc non Af Amer >90  >90 mL/min   GFR calc Af Amer >90  >90 mL/min  URIC ACID     Status: None   Collection Time    05/11/13 10:30 AM      Result Value Range   Uric Acid, Serum 5.4  2.4 - 7.0 mg/dL  LACTATE DEHYDROGENASE     Status: None   Collection Time    05/11/13 10:30 AM      Result Value Range   LDH 166  94 - 250 U/L     Review of Systems  Constitutional: Negative for fever and chills.  Eyes: Negative for blurred vision.  Gastrointestinal: Positive for abdominal pain. Negative for nausea, vomiting, diarrhea and constipation.       Bilateral lower pelvic pain   Genitourinary: Positive for urgency. Negative for dysuria, frequency and hematuria.  Neurological: Positive for headaches.   Physical Exam   Blood pressure 137/84, pulse 83, temperature 98.9 F (37.2 C), temperature source Oral, resp. rate 18, height 5\' 3"  (1.6 m), weight 103.42 kg (228 lb), last menstrual period 08/07/2012.  Physical Exam  Constitutional: She is oriented to person, place, and time. She appears well-developed and well-nourished. No distress.  HENT:  Head: Normocephalic.  Eyes: Pupils are equal, round, and reactive to light.  Neck: Neck supple.  Respiratory: Effort normal.  GI: Soft. There is tenderness.  Mid; upper quadrant tenderness. Pt is tender at fundus during a contraction.  Genitourinary:  No discharge noted on perineum during vaginal exam.    Neurological: She is alert and oriented to person, place, and time.  Skin: Skin is warm. She is not diaphoretic.  Psychiatric: Her behavior is normal.    Fetal Tracing: Baseline: 130 bpm  Variability: Moderate  Accelerations: 15x15 Decelerations: None  Toco: Occasional contraction   Dilation: 2 Effacement (%): 50 Exam by:: J. Rasch, NP -2; baby low in the pelvis   Cervical recheck:    MAU Course  Procedures None  MDM Consulted with Dr. Dareen Piano; [facility] labs ordered.  RN to recheck patients cervix; will notify Dr. Dareen Piano with [facility] labs Dr. Dareen Piano informed of Urine results; will treat for UTI, PIH labs normal, patients BP readings. Ok to send patient home with something for pain,  have her follow up in the office on Thursday.  Pt declined pain medication prior to discharge.  Assessment and Plan   A:  Elevated BP readings in third trimester Pelvic  pain UTI Braxton hicks contractions  P: Discharge home Percocet at [facility] RX: Macrobid Follow up in the office on Thursday Return to MAU if symptoms worsen Labor precautions  Iona Hansen Rasch, NP  05/11/2013, 10:12 AM

## 2013-05-12 ENCOUNTER — Telehealth (HOSPITAL_COMMUNITY): Payer: Self-pay | Admitting: *Deleted

## 2013-05-12 ENCOUNTER — Encounter (HOSPITAL_COMMUNITY): Payer: Self-pay | Admitting: *Deleted

## 2013-05-12 NOTE — Telephone Encounter (Signed)
Preadmission screen  

## 2013-05-15 ENCOUNTER — Inpatient Hospital Stay (HOSPITAL_COMMUNITY)
Admission: AD | Admit: 2013-05-15 | Discharge: 2013-05-15 | Disposition: A | Discharge status: Home / Self Care | Payer: Medicaid Other | Source: Ambulatory Visit | Attending: Obstetrics and Gynecology | Admitting: Obstetrics and Gynecology

## 2013-05-15 ENCOUNTER — Inpatient Hospital Stay (HOSPITAL_COMMUNITY): Payer: Medicaid Other | Admitting: Anesthesiology

## 2013-05-15 ENCOUNTER — Inpatient Hospital Stay (HOSPITAL_COMMUNITY)
Admission: AD | Admit: 2013-05-15 | Discharge: 2013-05-18 | DRG: 774 | Disposition: A | Discharge status: Home / Self Care | Payer: Medicaid Other | Source: Ambulatory Visit | Attending: Obstetrics and Gynecology | Admitting: Obstetrics and Gynecology

## 2013-05-15 ENCOUNTER — Encounter (HOSPITAL_COMMUNITY): Payer: Self-pay

## 2013-05-15 ENCOUNTER — Encounter (HOSPITAL_COMMUNITY): Payer: Self-pay | Admitting: *Deleted

## 2013-05-15 ENCOUNTER — Encounter (HOSPITAL_COMMUNITY): Payer: Medicaid Other | Admitting: Anesthesiology

## 2013-05-15 DIAGNOSIS — O479 False labor, unspecified: Secondary | ICD-10-CM | POA: Insufficient documentation

## 2013-05-15 DIAGNOSIS — O98519 Other viral diseases complicating pregnancy, unspecified trimester: Secondary | ICD-10-CM | POA: Diagnosis present

## 2013-05-15 DIAGNOSIS — O411090 Infection of amniotic sac and membranes, unspecified, unspecified trimester, not applicable or unspecified: Secondary | ICD-10-CM | POA: Diagnosis present

## 2013-05-15 DIAGNOSIS — O139 Gestational [pregnancy-induced] hypertension without significant proteinuria, unspecified trimester: Principal | ICD-10-CM | POA: Diagnosis present

## 2013-05-15 DIAGNOSIS — A6000 Herpesviral infection of urogenital system, unspecified: Secondary | ICD-10-CM | POA: Diagnosis present

## 2013-05-15 LAB — TYPE AND SCREEN
ABO/RH(D): O POS
Antibody Screen: NEGATIVE

## 2013-05-15 LAB — COMPREHENSIVE METABOLIC PANEL WITH GFR
ALT: 7 U/L (ref 0–35)
AST: 12 U/L (ref 0–37)
Albumin: 2.4 g/dL — ABNORMAL LOW (ref 3.5–5.2)
Alkaline Phosphatase: 182 U/L — ABNORMAL HIGH (ref 39–117)
BUN: 5 mg/dL — ABNORMAL LOW (ref 6–23)
CO2: 22 meq/L (ref 19–32)
Calcium: 9.1 mg/dL (ref 8.4–10.5)
Chloride: 100 meq/L (ref 96–112)
Creatinine, Ser: 0.61 mg/dL (ref 0.50–1.10)
GFR calc Af Amer: >90 mL/min
GFR calc non Af Amer: >90 mL/min
Glucose, Bld: 102 mg/dL — ABNORMAL HIGH (ref 70–99)
Potassium: 3.7 meq/L (ref 3.5–5.1)
Sodium: 134 meq/L — ABNORMAL LOW (ref 135–145)
Total Bilirubin: 0.2 mg/dL — ABNORMAL LOW (ref 0.3–1.2)
Total Protein: 6.7 g/dL (ref 6.0–8.3)

## 2013-05-15 LAB — CBC
HCT: 36.6 % (ref 36.0–46.0)
Hemoglobin: 12.8 g/dL (ref 12.0–15.0)
MCHC: 35 g/dL (ref 30.0–36.0)
MCV: 82.8 fL (ref 78.0–100.0)
RBC: 4.42 MIL/uL (ref 3.87–5.11)

## 2013-05-15 LAB — URINALYSIS, ROUTINE W REFLEX MICROSCOPIC
Bilirubin Urine: NEGATIVE
Hgb urine dipstick: NEGATIVE
Ketones, ur: NEGATIVE mg/dL
Protein, ur: NEGATIVE mg/dL
Specific Gravity, Urine: 1.015 (ref 1.005–1.030)
Urobilinogen, UA: 0.2 mg/dL (ref 0.0–1.0)
pH: 6.5 (ref 5.0–8.0)

## 2013-05-15 LAB — URINE MICROSCOPIC-ADD ON

## 2013-05-15 LAB — ABO/RH: ABO/RH(D): O POS

## 2013-05-15 MED ORDER — LACTATED RINGERS IV SOLN
500.0000 mL | INTRAVENOUS | Status: DC | PRN
  Administered 2013-05-15: 500 mL via INTRAVENOUS

## 2013-05-15 MED ORDER — FENTANYL 2.5 MCG/ML BUPIVACAINE 1/10 % EPIDURAL INFUSION (WH - ANES)
14.0000 mL/h | INTRAMUSCULAR | Status: DC | PRN
  Administered 2013-05-15: 14 mL/h via EPIDURAL
  Filled 2013-05-15 (×2): qty 125

## 2013-05-15 MED ORDER — OXYTOCIN 40 UNITS IN LACTATED RINGERS INFUSION - SIMPLE MED
1.0000 m[IU]/min | INTRAVENOUS | Status: DC
  Administered 2013-05-15: 2 m[IU]/min via INTRAVENOUS

## 2013-05-15 MED ORDER — ONDANSETRON HCL 4 MG/2ML IJ SOLN: 4.0000 mg | Freq: Four times a day (QID) | INTRAMUSCULAR | Status: DC | PRN

## 2013-05-15 MED ORDER — LACTATED RINGERS IV SOLN: 500.0000 mL | Freq: Once | INTRAVENOUS | Status: DC

## 2013-05-15 MED ORDER — OXYTOCIN 40 UNITS IN LACTATED RINGERS INFUSION - SIMPLE MED
1.0000 m[IU]/min | INTRAVENOUS | Status: DC
  Filled 2013-05-15: qty 1000

## 2013-05-15 MED ORDER — LACTATED RINGERS IV SOLN
INTRAVENOUS | Status: DC
  Administered 2013-05-15: 1000 mL via INTRAVENOUS
  Administered 2013-05-15: 13:00:00 via INTRAVENOUS

## 2013-05-15 MED ORDER — DIPHENHYDRAMINE HCL 50 MG/ML IJ SOLN: 12.5000 mg | INTRAMUSCULAR | Status: DC | PRN

## 2013-05-15 MED ORDER — LIDOCAINE HCL (PF) 1 % IJ SOLN
30.0000 mL | INTRAMUSCULAR | Status: DC | PRN
  Administered 2013-05-16: 30 mL via SUBCUTANEOUS
  Filled 2013-05-15 (×2): qty 30

## 2013-05-15 MED ORDER — OXYTOCIN 40 UNITS IN LACTATED RINGERS INFUSION - SIMPLE MED: 62.5000 mL/h | INTRAVENOUS | Status: DC

## 2013-05-15 MED ORDER — PHENYLEPHRINE 40 MCG/ML (10ML) SYRINGE FOR IV PUSH (FOR BLOOD PRESSURE SUPPORT)
80.0000 ug | PREFILLED_SYRINGE | INTRAVENOUS | Status: DC | PRN
  Filled 2013-05-15: qty 2
  Filled 2013-05-15: qty 10

## 2013-05-15 MED ORDER — CITRIC ACID-SODIUM CITRATE 334-500 MG/5ML PO SOLN: 30.0000 mL | ORAL | Status: DC | PRN

## 2013-05-15 MED ORDER — PHENYLEPHRINE 40 MCG/ML (10ML) SYRINGE FOR IV PUSH (FOR BLOOD PRESSURE SUPPORT)
80.0000 ug | PREFILLED_SYRINGE | INTRAVENOUS | Status: DC | PRN
  Filled 2013-05-15: qty 2

## 2013-05-15 MED ORDER — GENTAMICIN SULFATE 40 MG/ML IJ SOLN
170.0000 mg | Freq: Three times a day (TID) | INTRAVENOUS | Status: DC
  Administered 2013-05-16: 170 mg via INTRAVENOUS
  Filled 2013-05-15 (×2): qty 4.25

## 2013-05-15 MED ORDER — FENTANYL 2.5 MCG/ML BUPIVACAINE 1/10 % EPIDURAL INFUSION (WH - ANES)
INTRAMUSCULAR | Status: DC | PRN
  Administered 2013-05-15: 14 mL/h via EPIDURAL

## 2013-05-15 MED ORDER — LACTATED RINGERS IV SOLN
INTRAVENOUS | Status: DC
  Administered 2013-05-15: 22:00:00 via INTRAUTERINE

## 2013-05-15 MED ORDER — GENTAMICIN SULFATE 40 MG/ML IJ SOLN
200.0000 mg | Freq: Once | INTRAVENOUS | Status: AC
  Administered 2013-05-15: 200 mg via INTRAVENOUS
  Filled 2013-05-15: qty 5

## 2013-05-15 MED ORDER — TERBUTALINE SULFATE 1 MG/ML IJ SOLN: 0.2500 mg | Freq: Once | INTRAMUSCULAR | Status: DC | PRN

## 2013-05-15 MED ORDER — ACETAMINOPHEN 325 MG PO TABS
650.0000 mg | ORAL_TABLET | ORAL | Status: DC | PRN
  Filled 2013-05-15: qty 2

## 2013-05-15 MED ORDER — OXYTOCIN BOLUS FROM INFUSION
500.0000 mL | INTRAVENOUS | Status: DC
  Administered 2013-05-16: 500 mL via INTRAVENOUS

## 2013-05-15 MED ORDER — LIDOCAINE HCL (PF) 1 % IJ SOLN
INTRAMUSCULAR | Status: DC | PRN
  Administered 2013-05-15 (×2): 4 mL

## 2013-05-15 MED ORDER — TERBUTALINE SULFATE 1 MG/ML IJ SOLN: 0.2500 mg | Freq: Once | INTRAMUSCULAR | Status: AC | PRN

## 2013-05-15 MED ORDER — SODIUM CHLORIDE 0.9 % IV SOLN
2.0000 g | Freq: Four times a day (QID) | INTRAVENOUS | Status: DC
  Administered 2013-05-15 – 2013-05-16 (×2): 2 g via INTRAVENOUS
  Filled 2013-05-15 (×4): qty 2000

## 2013-05-15 MED ORDER — EPHEDRINE 5 MG/ML INJ
10.0000 mg | INTRAVENOUS | Status: DC | PRN
  Filled 2013-05-15: qty 4

## 2013-05-15 MED ORDER — IBUPROFEN 600 MG PO TABS: 600.0000 mg | ORAL_TABLET | Freq: Four times a day (QID) | ORAL | Status: DC | PRN

## 2013-05-15 MED ORDER — FLEET ENEMA 7-19 GM/118ML RE ENEM: 1.0000 | ENEMA | Freq: Once | RECTAL | Status: DC

## 2013-05-15 MED ORDER — ACETAMINOPHEN 325 MG PO TABS
650.0000 mg | ORAL_TABLET | Freq: Four times a day (QID) | ORAL | Status: DC | PRN
  Administered 2013-05-15: 650 mg via ORAL

## 2013-05-15 MED ORDER — EPHEDRINE 5 MG/ML INJ
10.0000 mg | INTRAVENOUS | Status: DC | PRN
  Filled 2013-05-15: qty 2

## 2013-05-15 NOTE — Anesthesia Procedure Notes (Signed)
Epidural Patient location during procedure: OB Start time: 05/15/2013 3:00 PM End time: 05/15/2013 3:15 PM  Staffing Anesthesiologist: Lewie Loron R Performed by: anesthesiologist   Preanesthetic Checklist Completed: patient identified, pre-op evaluation, timeout performed, IV checked, risks and benefits discussed and monitors and equipment checked  Epidural Patient position: sitting Prep: site prepped and draped and DuraPrep Patient monitoring: blood pressure, continuous pulse ox and heart rate Approach: midline Injection technique: LOR air and LOR saline  Needle:  Needle type: Tuohy  Needle gauge: 17 G Needle length: 9 cm Needle insertion depth: 8 cm Catheter type: closed end flexible Catheter size: 19 Gauge Catheter at skin depth: 14 cm Test dose: negative  Assessment Sensory level: T8 Events: blood not aspirated, injection not painful, no injection resistance, negative IV test and no paresthesia  Additional Notes Reason for block:procedure for pain

## 2013-05-15 NOTE — Progress Notes (Signed)
ANTIBIOTIC CONSULT NOTE - INITIAL  Pharmacy Consult for gentamicin Indication: maternal fever, R/O chorioamnionitis  Allergies  Allergen Reactions  . Hydrocodone Hives    Patient Measurements: Height: 5\' 3"  (160 cm) Weight: 223 lb (101.152 kg) IBW/kg (Calculated) : 52.4 Adjusted Body Weight: 67 kg  Vital Signs: Temp: 100.6 F (38.1 C) (12/13 2231) Temp src: Axillary (12/13 2231) BP: 141/80 mmHg (12/13 2231) Pulse Rate: 108 (12/13 2231) Intake/Output from previous day:   Intake/Output from this shift: Total I/O In: -  Out: 650 [Urine:650]  Labs:  Recent Labs  05/15/13 1325  WBC 24.8*  HGB 12.8  PLT 217  CREATININE 0.61   Estimated Creatinine Clearance: 125.2 ml/min (by C-G formula based on Cr of 0.61).   Microbiology: No results found for this or any previous visit (from the past 720 hour(s)).  Medical History: Past Medical History  Diagnosis Date  . Hives   . Allergic reaction   . Headache(784.0)   . Herpes   . Trichomonas     Medications:  Scheduled:  . ampicillin (OMNIPEN) IV  2 g Intravenous Q6H  . [START ON 05/16/2013] gentamicin  170 mg Intravenous Q8H  . gentamicin  200 mg Intravenous Once  . lactated ringers  500 mL Intravenous Once  . sodium phosphate  1 enema Rectal Once   Infusions:  . fentaNYL 2.5 mcg/ml w/bupivacaine 1/10% in NS epidural infusion ( total) 14 mL/hr (05/15/13 2147)  . lactated ringers 1,000 mL (05/15/13 1815)  . lactated ringers 150 mL/hr at 05/15/13 2217  . oxytocin 40 units in LR 1000 mL    . oxytocin 40 units in LR 1000 mL    . oxytocin 40 units in LR 1000 mL 2 milli-units/min (05/15/13 2115)   PRN: acetaminophen, acetaminophen, citric acid-sodium citrate, diphenhydrAMINE, ePHEDrine, fentaNYL 2.5 mcg/ml w/bupivacaine 1/10% in NS epidural infusion ( total), ibuprofen, lactated ringers, lidocaine (PF), ondansetron, phenylephrine, phenylephrine, terbutaline  Assessment: 65yr G1P0 term gestation  ROM, now with maternal fever.  Ampicillin 2gm IV q6h ordered.  Gentamicin tx to be added.  Goal of Therapy:  Desire gentamicin serum peak level 6-63mcg/ml and trough <67mcg/ml  Plan:  1. Loading dose gentamicin 200mg  IV x 1, then (if tx continued) 2. Maintenance regimen gentamicin 170mg  IV q8h 3.  Will check actual serum gentamicin levels if tx >48-72hr, or as clinically indicated  Scarlett Presto 05/15/2013,11:07 PM

## 2013-05-15 NOTE — MAU Note (Signed)
Pt refused to wait for discharge paperwork  

## 2013-05-15 NOTE — Progress Notes (Signed)
Checked in MAU by Joana Reamer in MAU. No lesions apparent, on acylovir

## 2013-05-15 NOTE — Anesthesia Preprocedure Evaluation (Signed)
Anesthesia Evaluation  Patient identified by MRN, date of birth, ID band Patient awake    Reviewed: Allergy & Precautions, H&P , NPO status , Patient's Chart, lab work & pertinent test results  Airway Mallampati: III TM Distance: >3 FB Neck ROM: Full    Dental   Pulmonary neg pulmonary ROS,          Cardiovascular negative cardio ROS  Rhythm:Regular     Neuro/Psych  Headaches, negative psych ROS   GI/Hepatic negative GI ROS, Neg liver ROS,   Endo/Other  Morbid obesity  Renal/GU negative Renal ROS     Musculoskeletal negative musculoskeletal ROS (+)   Abdominal   Peds  Hematology negative hematology ROS (+)   Anesthesia Other Findings   Reproductive/Obstetrics (+) Pregnancy                           Anesthesia Physical Anesthesia Plan  ASA: III  Anesthesia Plan: Epidural   Post-op Pain Management:    Induction:   Airway Management Planned:   Additional Equipment:   Intra-op Plan:   Post-operative Plan:   Informed Consent: I have reviewed the patients History and Physical, chart, labs and discussed the procedure including the risks, benefits and alternatives for the proposed anesthesia with the patient or authorized representative who has indicated his/her understanding and acceptance.     Plan Discussed with:   Anesthesia Plan Comments:         Anesthesia Quick Evaluation

## 2013-05-15 NOTE — H&P (Addendum)
22 y.o. [redacted]w[redacted]d  G1P0 comes in c/o LOF.  Otherwise has good fetal movement and no bleeding.  Past Medical History  Diagnosis Date  . Hives   . Allergic reaction   . Headache(784.0)   . Herpes   . Trichomonas     Past Surgical History  Procedure Laterality Date  . No past surgeries      OB History  Gravida Para Term Preterm AB SAB TAB Ectopic Multiple Living  1             # Outcome Date GA Lbr Len/2nd Weight Sex Delivery Anes PTL Lv  1 CUR               History   Social History  . Marital Status: Single    Spouse Name: N/A    Number of Children: N/A  . Years of Education: N/A   Occupational History  . Not on file.   Social History Main Topics  . Smoking status: Never Smoker   . Smokeless tobacco: Never Used  . Alcohol Use: No     Comment: occas  . Drug Use: No  . Sexual Activity: Not on file   Other Topics Concern  . Not on file   Social History Narrative  . No narrative on file   Hydrocodone    Prenatal Transfer Tool  Maternal Diabetes: No Genetic Screening: Normal Maternal Ultrasounds/Referrals: Normal Fetal Ultrasounds or other Referrals:  None Maternal Substance Abuse:  No Significant Maternal Medications:  None Significant Maternal Lab Results: Lab values include: Group B Strep negative  Other PNC: uncomplicated.    Filed Vitals:   05/15/13 1622  BP:   Pulse: 88  Temp:   Resp:      Lungs/Cor:  NAD Abdomen:  soft, gravid Ex:  no cords, erythema SVE:  2/50/-3 at admission FHTs:  125, good STV, NST R Toco:  q2-4   A/P   Admit to L&D with SROM, elevated BPs  Requested midlevel to assess vulva/vagina given HSV status.  No outbreaks recently, on Valtrex. Severe range BP x 1, labs normal, UA neg protein, now mild range, asymptomatic, will monitor.  Epidural when desired  GBS neg  Victoria Olson

## 2013-05-15 NOTE — MAU Note (Signed)
Pt states was here this am for labor eval, was 2cm and sent home. LOF one hour ago.

## 2013-05-15 NOTE — MAU Note (Signed)
Pt c/o uc that are 5 mins apart. Denies vag bleeding or LOF. States +FM.

## 2013-05-15 NOTE — Progress Notes (Signed)
Dr. Claiborne Billings notified of pt's c/o ctx's, unsure if ruptured, cervix 2.5/50, orders to obtain amnisure. No pain medication orders obtained.

## 2013-05-15 NOTE — Progress Notes (Signed)
Notified of pt's amnisure positive, bp's elevated, orders for admit labs, PIH labs, and epidural prn. Pt to be started on pitocin 2/2.

## 2013-05-16 ENCOUNTER — Encounter (HOSPITAL_COMMUNITY): Payer: Self-pay | Admitting: *Deleted

## 2013-05-16 LAB — CBC
HCT: 32.4 % — ABNORMAL LOW (ref 36.0–46.0)
MCH: 28.5 pg (ref 26.0–34.0)
MCHC: 34.6 g/dL (ref 30.0–36.0)
Platelets: 193 10*3/uL (ref 150–400)
RDW: 15.7 % — ABNORMAL HIGH (ref 11.5–15.5)

## 2013-05-16 MED ORDER — WITCH HAZEL-GLYCERIN EX PADS: 1.0000 "application " | MEDICATED_PAD | CUTANEOUS | Status: DC | PRN

## 2013-05-16 MED ORDER — ONDANSETRON HCL 4 MG/2ML IJ SOLN: 4.0000 mg | INTRAMUSCULAR | Status: DC | PRN

## 2013-05-16 MED ORDER — TETANUS-DIPHTH-ACELL PERTUSSIS 5-2.5-18.5 LF-MCG/0.5 IM SUSP
0.5000 mL | Freq: Once | INTRAMUSCULAR | Status: AC
  Administered 2013-05-17: 0.5 mL via INTRAMUSCULAR
  Filled 2013-05-16: qty 0.5

## 2013-05-16 MED ORDER — ZOLPIDEM TARTRATE 5 MG PO TABS: 5.0000 mg | ORAL_TABLET | Freq: Every evening | ORAL | Status: DC | PRN

## 2013-05-16 MED ORDER — ONDANSETRON HCL 4 MG PO TABS: 4.0000 mg | ORAL_TABLET | ORAL | Status: DC | PRN

## 2013-05-16 MED ORDER — IBUPROFEN 600 MG PO TABS
600.0000 mg | ORAL_TABLET | Freq: Four times a day (QID) | ORAL | Status: DC
  Administered 2013-05-16 – 2013-05-18 (×9): 600 mg via ORAL
  Filled 2013-05-16 (×9): qty 1

## 2013-05-16 MED ORDER — ACETAMINOPHEN 500 MG PO TABS
1000.0000 mg | ORAL_TABLET | Freq: Four times a day (QID) | ORAL | Status: DC | PRN
  Administered 2013-05-16 – 2013-05-17 (×2): 1000 mg via ORAL
  Filled 2013-05-16 (×2): qty 2

## 2013-05-16 MED ORDER — DIPHENHYDRAMINE HCL 25 MG PO CAPS: 25.0000 mg | ORAL_CAPSULE | Freq: Four times a day (QID) | ORAL | Status: DC | PRN

## 2013-05-16 MED ORDER — DIBUCAINE 1 % RE OINT: 1.0000 "application " | TOPICAL_OINTMENT | RECTAL | Status: DC | PRN

## 2013-05-16 MED ORDER — SIMETHICONE 80 MG PO CHEW: 80.0000 mg | CHEWABLE_TABLET | ORAL | Status: DC | PRN

## 2013-05-16 MED ORDER — PRENATAL MULTIVITAMIN CH
1.0000 | ORAL_TABLET | Freq: Every day | ORAL | Status: DC
  Administered 2013-05-16 – 2013-05-18 (×3): 1 via ORAL
  Filled 2013-05-16 (×3): qty 1

## 2013-05-16 MED ORDER — BENZOCAINE-MENTHOL 20-0.5 % EX AERO
1.0000 "application " | INHALATION_SPRAY | CUTANEOUS | Status: DC | PRN
  Administered 2013-05-16: 1 via TOPICAL
  Filled 2013-05-16: qty 56

## 2013-05-16 MED ORDER — SENNOSIDES-DOCUSATE SODIUM 8.6-50 MG PO TABS
2.0000 | ORAL_TABLET | ORAL | Status: DC
  Administered 2013-05-17: 2 via ORAL
  Filled 2013-05-16 (×2): qty 2

## 2013-05-16 MED ORDER — LANOLIN HYDROUS EX OINT: TOPICAL_OINTMENT | CUTANEOUS | Status: DC | PRN

## 2013-05-16 NOTE — Anesthesia Postprocedure Evaluation (Signed)
Anesthesia Post Note  Patient: Victoria Olson  Procedure(s) Performed: * No procedures listed *  Anesthesia type: Epidural  Patient location: Mother/Baby  Post pain: Pain level controlled  Post assessment: Post-op Vital signs reviewed  Last Vitals:  Filed Vitals:   05/16/13 0815  BP: 123/85  Pulse: 96  Temp: 36.8 C  Resp: 18    Post vital signs: Reviewed  Level of consciousness:alert  Complications: No apparent anesthesia complications

## 2013-05-16 NOTE — Progress Notes (Addendum)
Patient is eating, ambulating, voiding.  Pain control is good.  Appropriate lochia.  No complaints. No HA/vision change/RUQ pain.  Filed Vitals:   05/16/13 0502 05/16/13 0542 05/16/13 0712 05/16/13 0815  BP: 140/53 142/91 143/86 123/85  Pulse: 114 99 105 96  Temp: 99.5 F (37.5 C)  98.7 F (37.1 C) 98.3 F (36.8 C)  TempSrc: Axillary  Oral Oral  Resp: 18 20 18 18   Height:      Weight:      SpO2:        Fundus firm Perineum without swelling. No CT  Lab Results  Component Value Date   WBC 29.6* 05/16/2013   HGB 11.2* 05/16/2013   HCT 32.4* 05/16/2013   MCV 82.4 05/16/2013   PLT 193 05/16/2013    --/--/O POS, O POS (12/13 1705) A/P Post partum day 0 (4am). Chorioamnionitis, s/p amp/gent treatment, admission WBC approx 25, pp WBC approx 30.  Afebrile.  Monitor. Mild and normal BPs.  Routine care.     Philip Aspen

## 2013-05-17 LAB — CBC
MCH: 28.6 pg (ref 26.0–34.0)
MCHC: 34.2 g/dL (ref 30.0–36.0)
MCV: 83.7 fL (ref 78.0–100.0)
Platelets: 176 10*3/uL (ref 150–400)
RBC: 3.32 MIL/uL — ABNORMAL LOW (ref 3.87–5.11)
RDW: 16.2 % — ABNORMAL HIGH (ref 11.5–15.5)

## 2013-05-17 LAB — COMPREHENSIVE METABOLIC PANEL
ALT: 7 U/L (ref 0–35)
AST: 16 U/L (ref 0–37)
Albumin: 1.9 g/dL — ABNORMAL LOW (ref 3.5–5.2)
Calcium: 8.6 mg/dL (ref 8.4–10.5)
Creatinine, Ser: 0.68 mg/dL (ref 0.50–1.10)
Potassium: 3.9 mEq/L (ref 3.5–5.1)
Sodium: 135 mEq/L (ref 135–145)
Total Protein: 5.8 g/dL — ABNORMAL LOW (ref 6.0–8.3)

## 2013-05-17 NOTE — Progress Notes (Signed)
Ur chart review completed.  

## 2013-05-17 NOTE — Progress Notes (Signed)
PPD#2 Pt having elevated B/Ps. No headaches. Also WBC increased and pt had elevated temp. None in last 24 hours. Was txd for chorio; Baby cannot go home. Plan/ Will check CMET, CBC, Uric Acid and reassess for discharge.

## 2013-05-17 NOTE — Lactation Note (Signed)
This note was copied from the chart of Victoria Olson. Lactation Consultation Note: initial visit with mom. She reports that baby has been nursing well on the left breast but not on the right. Baby has not fed since 8:30 this morning. Unwrapped and undressed baby, she would not latch to right so mom wants to try left. Baby took a few attempts to open wide but once she latched she did well at the breast. Reviewed basic teaching with parents. Bf brochure given to mom with resources for support after DC. No questions at present. To call for assist prn  Patient Name: Victoria Luise Yamamoto HYQMV'H Date: 05/17/2013 Reason for consult: Initial assessment   Maternal Data Formula Feeding for Exclusion: No Infant to breast within first hour of birth: Yes Has patient been taught Hand Expression?: Yes Does the patient have breastfeeding experience prior to this delivery?: No  Feeding Feeding Type: Breast Fed  LATCH Score/Interventions Latch: Grasps breast easily, tongue down, lips flanged, rhythmical sucking.  Audible Swallowing: A few with stimulation  Type of Nipple: Everted at rest and after stimulation  Comfort (Breast/Nipple): Soft / non-tender     Hold (Positioning): Assistance needed to correctly position infant at breast and maintain latch. Intervention(s): Breastfeeding basics reviewed;Support Pillows  LATCH Score: 8  Lactation Tools Discussed/Used     Consult Status Consult Status: Follow-up Date: 05/18/13 Follow-up type: In-patient    Pamelia Hoit 05/17/2013, 1:54 PM

## 2013-05-18 NOTE — Discharge Summary (Addendum)
Obstetric Discharge Summary Reason for Admission: rupture of membranes Prenatal Procedures: none Intrapartum Procedures: spontaneous vaginal delivery Postpartum Procedures: bilateral vaginal wall tears repaired Complications-Operative and Postpartum: PIH and chorioamnionitis Hemoglobin  Date Value Range Status  05/17/2013 9.5* 12.0 - 15.0 g/dL Final     HCT  Date Value Range Status  05/17/2013 27.8* 36.0 - 46.0 % Final  }  Discharge Diagnoses: Term Pregnancy-delivered, Amnionitis and PIH  Discharge Information: Date: 05/18/2013 Activity: pelvic rest Diet: routine Medications: Ibuprofen Condition: stable Instructions: refer to practice specific booklet Discharge to: home Follow-up Information   Follow up with Claiborne Billings, SIDNEY, DO. (BP check)    Specialty:  Obstetrics and Gynecology   Contact information:   [address] [place] [address] [phone]       Newborn Data: Live born female  Birth Weight: 5 lb 5.5 oz (2424 g) APGAR: 9, 9  Home with mother.   Maythe Deramo A 05/18/2013, 7:53 AM

## 2013-05-18 NOTE — Progress Notes (Signed)
Patient is eating, ambulating, voiding.  Pain control is good.  No HA today.  Filed Vitals:   05/16/13 2000 05/17/13 0504 05/17/13 1700 05/18/13 0528  BP: 132/90 127/79 134/90 129/84  Pulse: 89 82 90 96  Temp: 98.3 F (36.8 C) 97.8 F (36.6 C) 98 F (36.7 C) 97.8 F (36.6 C)  TempSrc: Oral Oral Oral Oral  Resp: 18 18 18 18   Height:      Weight:      SpO2: 100%       Fundus firm Perineum without swelling.  Lab Results  Component Value Date   WBC 18.7* 05/17/2013   HGB 9.5* 05/17/2013   HCT 27.8* 05/17/2013   MCV 83.7 05/17/2013   PLT 176 05/17/2013    --/--/O POS, O POS (12/13 1705)/RI  A/P Post partum day 3.  Routine care.  Expect d/c today.  BPs stable and without any sx now.  F/u BP 1 week.  Davione Lenker A

## 2013-05-19 ENCOUNTER — Ambulatory Visit: Payer: Self-pay

## 2013-05-19 ENCOUNTER — Inpatient Hospital Stay (HOSPITAL_COMMUNITY): Admission: RE | Admit: 2013-05-19 | Payer: Medicaid Other | Source: Ambulatory Visit

## 2013-05-19 NOTE — Lactation Note (Addendum)
This note was copied from the chart of Victoria Makella Buckingham. Lactation Consultation Note  Patient Name: Victoria Olson [id number] Date: 05/19/2013   Visited with Victoria Olson on day of discharge, baby at 73 hrs old.  Victoria Olson discouraged about breast feeding as her "milk isn't enough".  She is offering supplement by bottle, as baby becomes sleepy at the breast, amounts of 20-25 ml.  Her bili level is elevated (14.3) requiring her to have follow up tomorrow, and her weight is at 8% weight loss.  Explained to Victoria Olson that due to baby's small size, supplementing and pumping is needed.  Basic teaching done to try to reassure her that this support would be temporary, but help baby be a better breast feeder by gaining weight, and lowering her bilirubin level.  Victoria Olson states she signed up for [facility] during her pregnancy.  Encouraged her to call the [facility] office about obtaining a DEBP, or an appointment to receive one, whereby we could give her a loaner pump.  Talked about importance of frequent pumping of 8-12 times in 24 hrs.  Small amount of colostrum collected in bottle at prior pumping.  Encouraged her to feed this to baby at next feeding.  Baby receiving 22 cal formula at present.    Faxed request to [facility] for loaner pump.   Instructed Victoria Olson to call out at desk if she wanted to have a $30 loaner pump from Lactation.  Explained that she would receive money back when she returned pump.    To follow up prn   Victoria Olson 05/19/2013, 11:11 AM

## 2014-04-04 ENCOUNTER — Encounter (HOSPITAL_COMMUNITY): Payer: Self-pay | Admitting: *Deleted

## 2014-05-22 ENCOUNTER — Inpatient Hospital Stay (HOSPITAL_COMMUNITY)
Admission: AD | Admit: 2014-05-22 | Discharge: 2014-05-22 | Disposition: A | Discharge status: Home / Self Care | Payer: Medicaid Other | Source: Ambulatory Visit | Attending: Family Medicine | Admitting: Family Medicine

## 2014-05-22 ENCOUNTER — Encounter (HOSPITAL_COMMUNITY): Payer: Self-pay | Admitting: *Deleted

## 2014-05-22 DIAGNOSIS — Z3201 Encounter for pregnancy test, result positive: Secondary | ICD-10-CM | POA: Insufficient documentation

## 2014-05-22 DIAGNOSIS — Z3490 Encounter for supervision of normal pregnancy, unspecified, unspecified trimester: Secondary | ICD-10-CM

## 2014-05-22 LAB — POCT PREGNANCY, URINE: Preg Test, Ur: POSITIVE — AB

## 2014-05-22 NOTE — MAU Note (Signed)
Pt presents to MAU for pregnancy test. Pt denies any vaginal bleeding or pain

## 2014-05-22 NOTE — MAU Provider Note (Signed)
  History     CSN: [id number]  Arrival date and time: 05/22/14 1129  Seen by provider at 1155    Chief Complaint  Patient presents with  . Possible Pregnancy   HPI Victoria Olson 23 y.o. 8330w5d  Comes to MAU wanting a pregnancy test today.  Had 2 positive tests at home yesterday.  Wants to know when she is due.   OB History    Gravida Para Term Preterm AB TAB SAB Ectopic Multiple Living   2 1 1       1       Past Medical History  Diagnosis Date  . Hives   . Allergic reaction   . Headache(784.0)   . Herpes   . Trichomonas     Past Surgical History  Procedure Laterality Date  . No past surgeries      Family History  Problem Relation Age of Onset  . Hypertension Father   . Cancer Mother     uterine  . Diabetes Maternal Uncle   . Hypertension Maternal Grandfather   . Stroke Maternal Grandfather   . Hypertension Paternal Grandfather   . Heart disease Paternal Grandfather   . Stroke Paternal Grandfather   . Hearing loss Neg Hx     History  Substance Use Topics  . Smoking status: Never Smoker   . Smokeless tobacco: Never Used  . Alcohol Use: No     Comment: occas    Allergies:  Allergies  Allergen Reactions  . Hydrocodone Hives    Prescriptions prior to admission  Medication Sig Dispense Refill Last Dose  . nitrofurantoin, macrocrystal-monohydrate, (MACROBID) 100 MG capsule Take 1 capsule (100 mg total) by mouth 2 (two) times daily. 10 capsule 0 05/14/2013 at Unknown time    Review of Systems  Constitutional: Negative for fever.  Gastrointestinal: Negative for nausea, vomiting and abdominal pain.  Genitourinary:       No vaginal discharge. No vaginal bleeding. No dysuria.  Neurological: Negative for headaches.   Physical Exam   Blood pressure 132/67, pulse 99, resp. rate 18, last menstrual period 03/22/2014, unknown if currently breastfeeding.  Physical Exam  Nursing note and vitals reviewed. Constitutional: She is oriented to person, place,  and time. She appears well-developed and well-nourished. No distress.  HENT:  Head: Normocephalic.  Eyes: EOM are normal.  Neck: Neck supple.  Respiratory: Effort normal.  Musculoskeletal: Normal range of motion.  Neurological: She is alert and oriented to person, place, and time.  Skin: Skin is warm and dry.  Psychiatric: She has a normal mood and affect.    MAU Course  Procedures Results for orders placed or performed during the hospital encounter of 05/22/14 (from the past 24 hour(s))  Pregnancy, urine POC     Status: Abnormal   Collection Time: 05/22/14 11:47 AM  Result Value Ref Range   Preg Test, Ur POSITIVE (A) NEGATIVE    MDM  Assessment and Plan  Early pregnancy  Plan Pregnancy verification form given [facility]. Given sheet of OTC meds which can be taken in pregnancy. No further questions voiced.    BURLESON,TERRI 05/22/2014, 11:50 AM

## 2014-06-03 NOTE — L&D Delivery Note (Signed)
Delivery Note Patient pushed well for < 10 minutes.  At 3:41 PM a viable female was delivered via Vaginal, Spontaneous Delivery (Presentation:Occiput ;Anterior  ).  APGAR: 8, 9; weight pending .   Placenta status: Intact, Spontaneous.  Cord: 3 vessels with the following complications: .  Cord pH: n/a  Anesthesia: Epidural  Episiotomy: None Lacerations: Labial Suture Repair: 3.0 vicryl rapide Est. Blood Loss (mL): 149  Mom to postpartum.  Baby to Couplet care / Skin to Skin.  [facility] GEFFEL Alexandros Ewan 01/26/2015, 4:27 PM

## 2014-06-20 LAB — OB RESULTS CONSOLE GC/CHLAMYDIA
Chlamydia: NEGATIVE
Gonorrhea: NEGATIVE

## 2014-06-20 LAB — OB RESULTS CONSOLE RUBELLA ANTIBODY, IGM: Rubella: IMMUNE

## 2014-06-20 LAB — OB RESULTS CONSOLE ABO/RH: RH TYPE: POSITIVE

## 2014-06-20 LAB — OB RESULTS CONSOLE ANTIBODY SCREEN: Antibody Screen: NEGATIVE

## 2014-06-20 LAB — OB RESULTS CONSOLE RPR: RPR: NONREACTIVE

## 2014-06-20 LAB — OB RESULTS CONSOLE HIV ANTIBODY (ROUTINE TESTING): HIV: NONREACTIVE

## 2014-06-20 LAB — OB RESULTS CONSOLE HEPATITIS B SURFACE ANTIGEN: HEP B S AG: NEGATIVE

## 2014-08-05 ENCOUNTER — Other Ambulatory Visit: Payer: Self-pay | Admitting: Obstetrics & Gynecology

## 2014-08-24 ENCOUNTER — Inpatient Hospital Stay (HOSPITAL_COMMUNITY)
Admission: AD | Admit: 2014-08-24 | Discharge: 2014-08-24 | Disposition: A | Discharge status: Home / Self Care | Payer: Medicaid Other | Source: Ambulatory Visit | Attending: Obstetrics and Gynecology | Admitting: Obstetrics and Gynecology

## 2014-08-24 ENCOUNTER — Encounter (HOSPITAL_COMMUNITY): Payer: Self-pay | Admitting: *Deleted

## 2014-08-24 DIAGNOSIS — Z3A19 19 weeks gestation of pregnancy: Secondary | ICD-10-CM | POA: Insufficient documentation

## 2014-08-24 DIAGNOSIS — M549 Dorsalgia, unspecified: Secondary | ICD-10-CM | POA: Diagnosis not present

## 2014-08-24 DIAGNOSIS — O9989 Other specified diseases and conditions complicating pregnancy, childbirth and the puerperium: Secondary | ICD-10-CM | POA: Insufficient documentation

## 2014-08-24 DIAGNOSIS — R51 Headache: Secondary | ICD-10-CM | POA: Diagnosis present

## 2014-08-24 LAB — URINALYSIS, ROUTINE W REFLEX MICROSCOPIC
Bilirubin Urine: NEGATIVE
Glucose, UA: NEGATIVE mg/dL
Hgb urine dipstick: NEGATIVE
KETONES UR: NEGATIVE mg/dL
Nitrite: NEGATIVE
PH: 7 (ref 5.0–8.0)
Protein, ur: NEGATIVE mg/dL
SPECIFIC GRAVITY, URINE: 1.015 (ref 1.005–1.030)
Urobilinogen, UA: 0.2 mg/dL (ref 0.0–1.0)

## 2014-08-24 LAB — URINE MICROSCOPIC-ADD ON

## 2014-08-24 MED ORDER — ACETAMINOPHEN 325 MG PO TABS: 650.0000 mg | ORAL_TABLET | Freq: Once | ORAL | Status: DC

## 2014-08-24 NOTE — MAU Note (Signed)
C/o headache that has been on-and off for a month; has not taken any medicine for headache; also c/o back pain for past month; has a yellow discharge;

## 2014-08-24 NOTE — MAU Note (Addendum)
Not feeling well. Has headache, runny nose(blood streaks noted in drainage), back hurts and pain in sides.   Had a little cold all week.  Back pain and headache for over a month. Denies fever, diarrhea or vomiting. Has not taken anything for headache

## 2014-09-02 ENCOUNTER — Other Ambulatory Visit: Payer: Self-pay | Admitting: Obstetrics & Gynecology

## 2014-11-16 ENCOUNTER — Inpatient Hospital Stay (HOSPITAL_COMMUNITY)
Admission: AD | Admit: 2014-11-16 | Discharge: 2014-11-16 | Disposition: A | Discharge status: Home / Self Care | Payer: Medicaid Other | Source: Ambulatory Visit | Attending: Obstetrics and Gynecology | Admitting: Obstetrics and Gynecology

## 2014-11-16 ENCOUNTER — Encounter (HOSPITAL_COMMUNITY): Payer: Self-pay

## 2014-11-16 DIAGNOSIS — Z3A30 30 weeks gestation of pregnancy: Secondary | ICD-10-CM | POA: Diagnosis not present

## 2014-11-16 DIAGNOSIS — R109 Unspecified abdominal pain: Secondary | ICD-10-CM | POA: Diagnosis present

## 2014-11-16 DIAGNOSIS — O26899 Other specified pregnancy related conditions, unspecified trimester: Secondary | ICD-10-CM

## 2014-11-16 DIAGNOSIS — O9989 Other specified diseases and conditions complicating pregnancy, childbirth and the puerperium: Secondary | ICD-10-CM | POA: Insufficient documentation

## 2014-11-16 LAB — URINALYSIS, ROUTINE W REFLEX MICROSCOPIC
BILIRUBIN URINE: NEGATIVE
GLUCOSE, UA: NEGATIVE mg/dL
HGB URINE DIPSTICK: NEGATIVE
Ketones, ur: NEGATIVE mg/dL
Nitrite: NEGATIVE
PH: 6.5 (ref 5.0–8.0)
Protein, ur: NEGATIVE mg/dL
SPECIFIC GRAVITY, URINE: 1.02 (ref 1.005–1.030)
Urobilinogen, UA: 0.2 mg/dL (ref 0.0–1.0)

## 2014-11-16 LAB — URINE MICROSCOPIC-ADD ON

## 2014-11-16 MED ORDER — ACETAMINOPHEN 500 MG PO TABS
1000.0000 mg | ORAL_TABLET | Freq: Once | ORAL | Status: AC
  Administered 2014-11-16: 1000 mg via ORAL
  Filled 2014-11-16: qty 2

## 2014-11-16 NOTE — MAU Provider Note (Signed)
History     CSN: [id number]  Arrival date and time: 11/16/14 1729   First Provider Initiated Contact with Patient 11/16/14 1833      Chief Complaint  Patient presents with  . Abdominal Cramping   HPI   [name] is a 24 y.o. female G2P1001 at 101w6d presenting with abdominal cramping. Over the weekend she started experiencing pelvic pressure; it worsening to lower abdominal cramping today. The cramping comes and goes, currently she just feels uncomfortable. At times the pain feels sharp. She currently rates her pain 3/10/ She has not taken anything for the pain.   Movement does not make the pain better or worse. Resting does not make the pain better or worse. When the baby kicks its seems to worsen the pain.   She denies vaginal bleeding She denies leaking fluid  + fetal movements.   OB History    Gravida Para Term Preterm AB TAB SAB Ectopic Multiple Living   Past Medical History  Diagnosis Date  . Hives   . Allergic reaction   . Headache(784.0)   . Herpes   . Trichomonas   . Hypertension     Past Surgical History  Procedure Laterality Date  . No past surgeries      Family History  Problem Relation Age of Onset  . Hypertension Father   . Cancer Mother     uterine  . Diabetes Maternal Uncle   . Hypertension Maternal Grandfather   . Stroke Maternal Grandfather   . Hypertension Paternal Grandfather   . Heart disease Paternal Grandfather   . Stroke Paternal Grandfather   . Hearing loss Neg Hx     History  Substance Use Topics  . Smoking status: Never Smoker   . Smokeless tobacco: Never Used  . Alcohol Use: No     Comment: occas    Allergies:  Allergies  Allergen Reactions  . Hydrocodone Hives    No prescriptions prior to admission   Results for orders placed or performed during the hospital encounter of 11/16/14 (from the past 48 hour(s))  Urinalysis, Routine w reflex microscopic (not at [facility])     Status: Abnormal   Collection Time: 11/16/14  5:45 PM  Result Value Ref Range   Color, Urine YELLOW YELLOW   APPearance HAZY (A) CLEAR   Specific Gravity, Urine 1.020 1.005 - 1.030   pH 6.5 5.0 - 8.0   Glucose, UA NEGATIVE NEGATIVE mg/dL   Hgb urine dipstick NEGATIVE NEGATIVE   Bilirubin Urine NEGATIVE NEGATIVE   Ketones, ur NEGATIVE NEGATIVE mg/dL   Protein, ur NEGATIVE NEGATIVE mg/dL   Urobilinogen, UA 0.2 0.0 - 1.0 mg/dL   Nitrite NEGATIVE NEGATIVE   Leukocytes, UA MODERATE (A) NEGATIVE  Urine microscopic-add on     Status: Abnormal   Collection Time: 11/16/14  5:45 PM  Result Value Ref Range   Squamous Epithelial / LPF MANY (A) RARE   WBC, UA 3-6 <3 WBC/hpf   Urine-Other MUCOUS PRESENT      Review of Systems  Constitutional: Negative for fever and chills.  Gastrointestinal: Positive for abdominal pain. Negative for nausea and vomiting.  Genitourinary: Negative for dysuria, urgency and frequency.  Musculoskeletal: Positive for back pain.   Physical Exam   Blood pressure 129/75, pulse 102, temperature 98.3 F (36.8 C), temperature source Oral, resp. rate 18, height  (1.6 m), weight 102.059 kg (225 lb), last menstrual period  03/22/2014, unknown if currently breastfeeding.  Physical Exam  Constitutional: She is oriented to person, place, and time. She appears well-developed and well-nourished. No distress.  HENT:  Head: Normocephalic.  Eyes: Pupils are equal, round, and reactive to light.  Neck: Neck supple.  Respiratory: Effort normal.  GI: Soft. She exhibits no distension. There is no tenderness. There is no rebound and no guarding.  Genitourinary:  Cervix closed, posterior   Musculoskeletal: Normal range of motion.  Neurological: She is alert and oriented to person, place, and time.  Skin: Skin is warm. She is not diaphoretic.  Psychiatric: Her behavior is normal.   Fetal Tracing: Baseline: 125 bpm  Variability: moderate  Accelerations: 15x15 Decelerations: variable  Toco:  quiet     MAU Course  Procedures  None  MDM  Urine culture pending Tylenol 1 gram PO  Pain is 1/10 at the time of discharge. Pain seems likely related to babies position/ babies movements  Assessment and Plan   A:  1. Abdominal pain in pregnancy, antepartum     P:  Discharge home in stable condition Ok to take tylenol as directed on the bottle Kick counts Return to MAU if symptoms worsen  Pregnancy support belt recommended.   Duane Lope, NP 11/16/2014 7:19 PM

## 2014-11-16 NOTE — Discharge Instructions (Signed)

## 2014-11-16 NOTE — MAU Note (Signed)
Been having a lot of stomach cramps, over the weekend had a lot of pelvic pressure, seems to have moved into stomach.yesterday was going to come, but it slowed down.  Today, it started up again, really hurting.

## 2014-11-18 LAB — URINE CULTURE: Special Requests: NORMAL

## 2014-12-08 ENCOUNTER — Other Ambulatory Visit: Payer: Self-pay | Admitting: Obstetrics and Gynecology

## 2014-12-22 ENCOUNTER — Other Ambulatory Visit: Payer: Self-pay | Admitting: Obstetrics and Gynecology

## 2014-12-22 LAB — OB RESULTS CONSOLE GBS: STREP GROUP B AG: POSITIVE

## 2015-01-23 ENCOUNTER — Telehealth (HOSPITAL_COMMUNITY): Payer: Self-pay | Admitting: *Deleted

## 2015-01-23 NOTE — Telephone Encounter (Signed)
Preadmission screen  

## 2015-01-26 ENCOUNTER — Inpatient Hospital Stay (HOSPITAL_COMMUNITY): Payer: Medicaid Other | Admitting: Anesthesiology

## 2015-01-26 ENCOUNTER — Encounter (HOSPITAL_COMMUNITY): Payer: Self-pay

## 2015-01-26 ENCOUNTER — Inpatient Hospital Stay (HOSPITAL_COMMUNITY)
Admission: RE | Admit: 2015-01-26 | Discharge: 2015-01-28 | DRG: 774 | Disposition: A | Discharge status: Home / Self Care | Payer: Medicaid Other | Source: Ambulatory Visit | Attending: Obstetrics | Admitting: Obstetrics

## 2015-01-26 DIAGNOSIS — B009 Herpesviral infection, unspecified: Secondary | ICD-10-CM | POA: Diagnosis present

## 2015-01-26 DIAGNOSIS — O99824 Streptococcus B carrier state complicating childbirth: Secondary | ICD-10-CM | POA: Diagnosis present

## 2015-01-26 DIAGNOSIS — O9852 Other viral diseases complicating childbirth: Secondary | ICD-10-CM | POA: Diagnosis present

## 2015-01-26 DIAGNOSIS — Z3490 Encounter for supervision of normal pregnancy, unspecified, unspecified trimester: Secondary | ICD-10-CM

## 2015-01-26 DIAGNOSIS — O480 Post-term pregnancy: Principal | ICD-10-CM | POA: Diagnosis present

## 2015-01-26 DIAGNOSIS — O9902 Anemia complicating childbirth: Secondary | ICD-10-CM | POA: Diagnosis present

## 2015-01-26 DIAGNOSIS — Z3A41 41 weeks gestation of pregnancy: Secondary | ICD-10-CM | POA: Diagnosis present

## 2015-01-26 HISTORY — DX: Herpesviral infection of urogenital system, unspecified: A60.00

## 2015-01-26 HISTORY — DX: Anemia, unspecified: D64.9

## 2015-01-26 HISTORY — DX: Personal history of other infectious and parasitic diseases: Z86.19

## 2015-01-26 LAB — CBC
HEMATOCRIT: 32.5 % — AB (ref 36.0–46.0)
Hemoglobin: 10.5 g/dL — ABNORMAL LOW (ref 12.0–15.0)
MCH: 26 pg (ref 26.0–34.0)
MCHC: 32.3 g/dL (ref 30.0–36.0)
MCV: 80.4 fL (ref 78.0–100.0)
Platelets: 202 10*3/uL (ref 150–400)
RBC: 4.04 MIL/uL (ref 3.87–5.11)
RDW: 16.2 % — AB (ref 11.5–15.5)
WBC: 13.7 10*3/uL — AB (ref 4.0–10.5)

## 2015-01-26 LAB — TYPE AND SCREEN
ABO/RH(D): O POS
Antibody Screen: NEGATIVE

## 2015-01-26 LAB — RPR: RPR Ser Ql: NONREACTIVE

## 2015-01-26 MED ORDER — FLEET ENEMA 7-19 GM/118ML RE ENEM: 1.0000 | ENEMA | RECTAL | Status: DC | PRN

## 2015-01-26 MED ORDER — PHENYLEPHRINE 40 MCG/ML (10ML) SYRINGE FOR IV PUSH (FOR BLOOD PRESSURE SUPPORT)
80.0000 ug | PREFILLED_SYRINGE | INTRAVENOUS | Status: DC | PRN
  Filled 2015-01-26: qty 2
  Filled 2015-01-26: qty 20

## 2015-01-26 MED ORDER — LANOLIN HYDROUS EX OINT: TOPICAL_OINTMENT | CUTANEOUS | Status: DC | PRN

## 2015-01-26 MED ORDER — CITRIC ACID-SODIUM CITRATE 334-500 MG/5ML PO SOLN: 30.0000 mL | ORAL | Status: DC | PRN

## 2015-01-26 MED ORDER — OXYCODONE-ACETAMINOPHEN 5-325 MG PO TABS
1.0000 | ORAL_TABLET | Freq: Four times a day (QID) | ORAL | Status: DC | PRN
  Administered 2015-01-27: 1 via ORAL
  Filled 2015-01-26: qty 1

## 2015-01-26 MED ORDER — DIPHENHYDRAMINE HCL 25 MG PO CAPS: 25.0000 mg | ORAL_CAPSULE | Freq: Four times a day (QID) | ORAL | Status: DC | PRN

## 2015-01-26 MED ORDER — LACTATED RINGERS IV SOLN
INTRAVENOUS | Status: DC
  Administered 2015-01-26: 125 mL via INTRAVENOUS

## 2015-01-26 MED ORDER — ONDANSETRON HCL 4 MG/2ML IJ SOLN: 4.0000 mg | Freq: Four times a day (QID) | INTRAMUSCULAR | Status: DC | PRN

## 2015-01-26 MED ORDER — SIMETHICONE 80 MG PO CHEW: 80.0000 mg | CHEWABLE_TABLET | ORAL | Status: DC | PRN

## 2015-01-26 MED ORDER — IBUPROFEN 600 MG PO TABS
600.0000 mg | ORAL_TABLET | Freq: Four times a day (QID) | ORAL | Status: DC
  Administered 2015-01-26 – 2015-01-28 (×7): 600 mg via ORAL
  Filled 2015-01-26 (×7): qty 1

## 2015-01-26 MED ORDER — EPHEDRINE 5 MG/ML INJ
10.0000 mg | INTRAVENOUS | Status: DC | PRN
  Filled 2015-01-26: qty 2

## 2015-01-26 MED ORDER — SODIUM CHLORIDE 0.9 % IV SOLN: 250.0000 mL | INTRAVENOUS | Status: DC | PRN

## 2015-01-26 MED ORDER — TETANUS-DIPHTH-ACELL PERTUSSIS 5-2.5-18.5 LF-MCG/0.5 IM SUSP: 0.5000 mL | Freq: Once | INTRAMUSCULAR | Status: DC

## 2015-01-26 MED ORDER — LIDOCAINE HCL (PF) 1 % IJ SOLN
INTRAMUSCULAR | Status: DC | PRN
  Administered 2015-01-26 (×2): 4 mL via EPIDURAL
  Administered 2015-01-26: 2 mL via EPIDURAL

## 2015-01-26 MED ORDER — LIDOCAINE HCL (PF) 1 % IJ SOLN
30.0000 mL | INTRAMUSCULAR | Status: DC | PRN
  Filled 2015-01-26: qty 30

## 2015-01-26 MED ORDER — PENICILLIN G POTASSIUM 5000000 UNITS IJ SOLR
2.5000 10*6.[IU] | INTRAVENOUS | Status: DC
  Administered 2015-01-26: 2.5 10*6.[IU] via INTRAVENOUS
  Filled 2015-01-26 (×6): qty 2.5

## 2015-01-26 MED ORDER — OXYTOCIN 40 UNITS IN LACTATED RINGERS INFUSION - SIMPLE MED
1.0000 m[IU]/min | INTRAVENOUS | Status: DC
  Administered 2015-01-26: 2 m[IU]/min via INTRAVENOUS
  Filled 2015-01-26: qty 1000

## 2015-01-26 MED ORDER — ACETAMINOPHEN 325 MG PO TABS: 650.0000 mg | ORAL_TABLET | ORAL | Status: DC | PRN

## 2015-01-26 MED ORDER — SENNOSIDES-DOCUSATE SODIUM 8.6-50 MG PO TABS
2.0000 | ORAL_TABLET | ORAL | Status: DC
  Administered 2015-01-26 – 2015-01-27 (×2): 2 via ORAL
  Filled 2015-01-26 (×2): qty 2

## 2015-01-26 MED ORDER — MISOPROSTOL 25 MCG QUARTER TABLET
25.0000 ug | ORAL_TABLET | ORAL | Status: DC | PRN
  Administered 2015-01-26 (×2): 25 ug via VAGINAL
  Filled 2015-01-26: qty 0.25
  Filled 2015-01-26: qty 1
  Filled 2015-01-26: qty 0.25

## 2015-01-26 MED ORDER — PENICILLIN G POTASSIUM 5000000 UNITS IJ SOLR
5.0000 10*6.[IU] | Freq: Once | INTRAVENOUS | Status: AC
  Administered 2015-01-26: 5 10*6.[IU] via INTRAVENOUS
  Filled 2015-01-26 (×2): qty 5

## 2015-01-26 MED ORDER — FENTANYL 2.5 MCG/ML BUPIVACAINE 1/10 % EPIDURAL INFUSION (WH - ANES)
14.0000 mL/h | INTRAMUSCULAR | Status: DC | PRN
  Administered 2015-01-26 (×2): 14 mL/h via EPIDURAL
  Filled 2015-01-26: qty 125

## 2015-01-26 MED ORDER — OXYTOCIN BOLUS FROM INFUSION: 500.0000 mL | INTRAVENOUS | Status: DC

## 2015-01-26 MED ORDER — ONDANSETRON HCL 4 MG/2ML IJ SOLN: 4.0000 mg | INTRAMUSCULAR | Status: DC | PRN

## 2015-01-26 MED ORDER — BENZOCAINE-MENTHOL 20-0.5 % EX AERO
1.0000 "application " | INHALATION_SPRAY | CUTANEOUS | Status: DC | PRN
  Administered 2015-01-26: 1 via TOPICAL
  Filled 2015-01-26: qty 56

## 2015-01-26 MED ORDER — OXYCODONE-ACETAMINOPHEN 5-325 MG PO TABS: 1.0000 | ORAL_TABLET | ORAL | Status: DC | PRN

## 2015-01-26 MED ORDER — ONDANSETRON HCL 4 MG PO TABS: 4.0000 mg | ORAL_TABLET | ORAL | Status: DC | PRN

## 2015-01-26 MED ORDER — LACTATED RINGERS IV SOLN
500.0000 mL | INTRAVENOUS | Status: DC | PRN
  Administered 2015-01-26: 1000 mL via INTRAVENOUS

## 2015-01-26 MED ORDER — TERBUTALINE SULFATE 1 MG/ML IJ SOLN
0.2500 mg | Freq: Once | INTRAMUSCULAR | Status: DC | PRN
Start: 2015-01-26 — End: 2015-01-26
  Filled 2015-01-26: qty 1

## 2015-01-26 MED ORDER — PRENATAL MULTIVITAMIN CH
1.0000 | ORAL_TABLET | Freq: Every day | ORAL | Status: DC
  Filled 2015-01-26: qty 1

## 2015-01-26 MED ORDER — OXYCODONE-ACETAMINOPHEN 5-325 MG PO TABS: 2.0000 | ORAL_TABLET | ORAL | Status: DC | PRN

## 2015-01-26 MED ORDER — DIBUCAINE 1 % RE OINT: 1.0000 "application " | TOPICAL_OINTMENT | RECTAL | Status: DC | PRN

## 2015-01-26 MED ORDER — SODIUM CHLORIDE 0.9 % IJ SOLN: 3.0000 mL | Freq: Two times a day (BID) | INTRAMUSCULAR | Status: DC

## 2015-01-26 MED ORDER — WITCH HAZEL-GLYCERIN EX PADS: 1.0000 "application " | MEDICATED_PAD | CUTANEOUS | Status: DC | PRN

## 2015-01-26 MED ORDER — SODIUM CHLORIDE 0.9 % IJ SOLN: 3.0000 mL | INTRAMUSCULAR | Status: DC | PRN

## 2015-01-26 MED ORDER — DIPHENHYDRAMINE HCL 50 MG/ML IJ SOLN: 12.5000 mg | INTRAMUSCULAR | Status: DC | PRN

## 2015-01-26 MED ORDER — OXYTOCIN 40 UNITS IN LACTATED RINGERS INFUSION - SIMPLE MED: 62.5000 mL/h | INTRAVENOUS | Status: DC

## 2015-01-26 NOTE — H&P (Signed)
24 y.o. G2P1001 @ [redacted]w[redacted]d presented overnight for IOL for post-term pregnancy.  Otherwise has good fetal movement and no bleeding.  Denies prodromal sx of herpes outbreak--no burning/tingling/lesions.  On valtrex  Past Medical History  Diagnosis Date  . Hives   . Allergic reaction   . Genital herpes   . Anemia     Past Surgical History  Procedure Laterality Date  . No past surgeries      OB History  Gravida Para Term Preterm AB SAB TAB Ectopic Multiple Living  # Outcome Date GA Lbr Len/2nd Weight Sex Delivery Anes PTL Lv  2 Current           1 Term 05/16/13 [redacted]w[redacted]d 14:01 / 00:50 2.424 kg (5 lb 5.5 oz) F Vag-Spont EPI  Y      Social History   Social History  . Marital Status: Single    Spouse Name: N/A  . Number of Children: N/A  . Years of Education: N/A   Occupational History  . Not on file.   Social History Main Topics  . Smoking status: Never Smoker   . Smokeless tobacco: Never Used  . Alcohol Use: No     Comment: occas  . Drug Use: No  . Sexual Activity: Yes    Birth Control/ Protection: Pill   Other Topics Concern  . Not on file   Social History Narrative   Hydrocodone    Prenatal Transfer Tool  Maternal Diabetes: No Genetic Screening: Normal Maternal Ultrasounds/Referrals: Normal Fetal Ultrasounds or other Referrals:  None Maternal Substance Abuse:  No Significant Maternal Medications:  None Significant Maternal Lab Results: Lab values include: Group B Strep negative  ABO, Rh: --/--/O POS (08/25 0240) Antibody: NEG (08/25 0240) Rubella:  Immune RPR: Nonreactive (01/18 0000)  HBsAg: Negative (01/18 0000)  HIV: Non-reactive (01/18 0000)  GBS: Positive (07/21 0000)    Other PNC: uncomplicated.    Filed Vitals:   01/26/15 0759  BP: 113/70  Pulse: 88  Temp: 98.2 F (36.8 C)  Resp:      General:  NAD  Abdomen:  soft, gravid, EFW 7# Ex:  trace edema SSE: no visible herpetic lesions on vulva/vagina/cervix SVE:   deferred FHTs:  140s, mod var, + accels, no decels, cat 1 Toco:  Irregular ctx   A/P   24 y.o. G2P1001 [redacted]w[redacted]d presents for IOL for post-term pregnancy IOL--second cytotec placed.  Pitocin/AROM prn H/o HSV--no lesions on exam or prodromal sx FSR/ vtx/ GBS positive--PCN  Yareth Macdonnell GEFFEL Remas Sobel

## 2015-01-26 NOTE — Anesthesia Procedure Notes (Signed)
Epidural Patient location during procedure: OB  Staffing Anesthesiologist: Yves Fodor Performed by: anesthesiologist   Preanesthetic Checklist Completed: patient identified, site marked, surgical consent, pre-op evaluation, timeout performed, IV checked, risks and benefits discussed and monitors and equipment checked  Epidural Patient position: sitting Prep: site prepped and draped and DuraPrep Patient monitoring: continuous pulse ox and blood pressure Approach: midline Location: L3-L4 Injection technique: LOR air  Needle:  Needle type: Tuohy  Needle gauge: 17 G Needle length: 9 cm and 9 Needle insertion depth: 7 cm Catheter type: closed end flexible Catheter size: 19 Gauge Catheter at skin depth: 13 cm Test dose: negative  Assessment Events: blood not aspirated, injection not painful, no injection resistance, negative IV test and no paresthesia  Additional Notes Patient identified. Risks/Benefits/Options discussed with patient including but not limited to bleeding, infection, nerve damage, paralysis, failed block, incomplete pain control, headache, blood pressure changes, nausea, vomiting, reactions to medications, itching and postpartum back pain. Confirmed with bedside nurse the patient's most recent platelet count. Confirmed with patient that they are not currently taking any anticoagulation, have any bleeding history or any family history of bleeding disorders. Patient expressed understanding and wished to proceed. All questions were answered. Sterile technique was used throughout the entire procedure. Please see nursing notes for vital signs. Test dose was given through epidural catheter and negative prior to continuing to dose epidural or start infusion. Warning signs of high block given to the patient including shortness of breath, tingling/numbness in hands, complete motor block, or any concerning symptoms with instructions to call for help. Patient was given instructions  on fall risk and not to get out of bed. All questions and concerns addressed with instructions to call with any issues or inadequate analgesia.     

## 2015-01-26 NOTE — Progress Notes (Signed)
Dr. Chestine Spore called to inform pt status and need for repeat cytotec and hold on PCN until active labor

## 2015-01-26 NOTE — Anesthesia Preprocedure Evaluation (Signed)
Anesthesia Evaluation  Patient identified by MRN, date of birth, ID band Patient awake    Reviewed: Allergy & Precautions, H&P , NPO status , Patient's Chart, lab work & pertinent test results, reviewed documented beta blocker date and time   Airway Mallampati: II  TM Distance: >3 FB Neck ROM: full    Dental no notable dental hx.    Pulmonary neg pulmonary ROS,  breath sounds clear to auscultation  Pulmonary exam normal       Cardiovascular negative cardio ROS Normal cardiovascular examRhythm:regular Rate:Normal     Neuro/Psych negative neurological ROS  negative psych ROS   GI/Hepatic negative GI ROS, Neg liver ROS,   Endo/Other  negative endocrine ROS  Renal/GU negative Renal ROS  negative genitourinary   Musculoskeletal   Abdominal   Peds  Hematology negative hematology ROS (+)   Anesthesia Other Findings Pregnancy - uncomplicated Platelets and allergies reviewed Denies active cardiac or pulmonary symptoms, METS > 4  Denies blood thinning medications, bleeding disorders, hypertension, asthma, supine hypotension syndrome, previous anesthesia difficulties    Reproductive/Obstetrics (+) Pregnancy                             Anesthesia Physical Anesthesia Plan  ASA: III  Anesthesia Plan: Epidural   Post-op Pain Management:    Induction:   Airway Management Planned:   Additional Equipment:   Intra-op Plan:   Post-operative Plan:   Informed Consent: I have reviewed the patients History and Physical, chart, labs and discussed the procedure including the risks, benefits and alternatives for the proposed anesthesia with the patient or authorized representative who has indicated his/her understanding and acceptance.     Plan Discussed with:   Anesthesia Plan Comments:         Anesthesia Quick Evaluation

## 2015-01-26 NOTE — Progress Notes (Signed)
Patient comfortable w epidural  Toco: q2-4 min EFM: 120s, mod var, + accels SVE: 4/50/-2, AROM clear fluid  G2P1 @ [redacted]w[redacted]d w IOL for post-term pregnancy Cont pitocin GBS + on pcn FSR--anticipate SVD

## 2015-01-27 ENCOUNTER — Encounter (HOSPITAL_COMMUNITY): Payer: Self-pay

## 2015-01-27 LAB — CBC
HEMATOCRIT: 33.4 % — AB (ref 36.0–46.0)
HEMOGLOBIN: 10.9 g/dL — AB (ref 12.0–15.0)
MCH: 26.3 pg (ref 26.0–34.0)
MCHC: 32.6 g/dL (ref 30.0–36.0)
MCV: 80.5 fL (ref 78.0–100.0)
Platelets: 222 10*3/uL (ref 150–400)
RBC: 4.15 MIL/uL (ref 3.87–5.11)
RDW: 16.4 % — ABNORMAL HIGH (ref 11.5–15.5)
WBC: 14.4 10*3/uL — ABNORMAL HIGH (ref 4.0–10.5)

## 2015-01-27 LAB — CCBB MATERNAL DONOR DRAW

## 2015-01-27 MED ORDER — RHO D IMMUNE GLOBULIN 1500 UNIT/2ML IJ SOSY
300.0000 ug | PREFILLED_SYRINGE | Freq: Once | INTRAMUSCULAR | Status: DC
  Filled 2015-01-27: qty 2

## 2015-01-27 NOTE — Lactation Note (Signed)
This note was copied from the chart of Victoria Olson. Lactation Consultation Note  Patient Name: Victoria Alayne Estrella [id number] Date: 01/27/2015 Reason for consult: Initial assessment  With this mom and term baby, now 64 hours old. Mom has huge breasts, which make it very difficult to latch baby, and position so baby can breathe. i assisted mom with a cradle hold, with baby in a vertical position, and this way mom was able to see the baby, and gently hold her upper breast from the baby's nose. The baby was too sleepy to feed, so mom kept baby skin to skin. I reviewed lactation services with mom, and reviewed the baby and me book breastfeeding pages with mom. Mom knows to call for questions/conerns.    Maternal Data Formula Feeding for Exclusion: No Has patient been taught Hand Expression?: Yes Does the patient have breastfeeding experience prior to this delivery?: Yes  Feeding Feeding Type: Breast Fed Length of feed: 50 min  LATCH Score/Interventions Latch: Repeated attempts needed to sustain latch, nipple held in mouth throughout feeding, stimulation needed to elicit sucking reflex. Intervention(s): Adjust position;Assist with latch  Audible Swallowing: None (no colostrum seen with hand expression) Intervention(s): Skin to skin;Hand expression  Type of Nipple: Everted at rest and after stimulation  Comfort (Breast/Nipple): Soft / non-tender (mom has huge , slft breast - difficult for mom to latch baby due to breast size)     Hold (Positioning): Assistance needed to correctly position infant at breast and maintain latch. Intervention(s): Breastfeeding basics reviewed;Support Pillows;Position options;Skin to skin  LATCH Score: 6  Lactation Tools Discussed/Used     Consult Status Consult Status: Follow-up Date: 01/28/15 Follow-up type: In-patient    Alfred Levins 01/27/2015, 10:01 AM

## 2015-01-27 NOTE — Progress Notes (Signed)
UR chart review completed.  

## 2015-01-27 NOTE — Anesthesia Postprocedure Evaluation (Signed)
Anesthesia Post Note  Patient: Victoria Olson  Procedure(s) Performed: * No procedures listed *  Anesthesia type: Epidural  Patient location: Mother/Baby  Post pain: Pain level controlled  Post assessment: Post-op Vital signs reviewed  Last Vitals:  Filed Vitals:   01/27/15 0616  BP: 125/65  Pulse: 84  Temp: 36.8 C  Resp: 20    Post vital signs: Reviewed  Level of consciousness: awake  Complications: No apparent anesthesia complications

## 2015-01-27 NOTE — Progress Notes (Signed)
Post Partum Day 1 Subjective: no complaints, up ad lib, voiding, tolerating PO, + flatus and breast feeding, but baby not feeding well Moderate lochia  Objective: Blood pressure 125/65, pulse 84, temperature 98.2 F (36.8 C), temperature source Oral, resp. rate 20, height  (1.6 m), weight 105.235 kg (232 lb), last menstrual period 03/22/2014, SpO2 98 %, unknown if currently breastfeeding.  Physical Exam:  General: alert, cooperative and no distress Lochia: appropriate Uterine Fundus: firm perineum: healing well, no significant drainage DVT Evaluation: No evidence of DVT seen on physical exam. Negative Homan's sign. No cords or calf tenderness. No significant calf/ankle edema.   Recent Labs  01/26/15 0240 01/27/15 0558  HGB 10.5* 10.9*  HCT 32.5* 33.4*    Assessment/Plan: Plan for discharge tomorrow, Breastfeeding, Lactation consult and Circumcision in office   LOS: 1 day   Victoria Olson Victoria Olson 01/27/2015, 9:45 AM

## 2015-01-28 MED ORDER — IBUPROFEN 600 MG PO TABS: 600.0000 mg | ORAL_TABLET | Freq: Four times a day (QID) | ORAL | Status: DC | PRN

## 2015-01-28 MED ORDER — SENNOSIDES-DOCUSATE SODIUM 8.6-50 MG PO TABS: 2.0000 | ORAL_TABLET | Freq: Every evening | ORAL | Status: DC | PRN

## 2015-01-28 MED ORDER — OXYCODONE-ACETAMINOPHEN 5-325 MG PO TABS: 1.0000 | ORAL_TABLET | Freq: Four times a day (QID) | ORAL | Status: DC | PRN

## 2015-01-28 NOTE — Progress Notes (Signed)
Post Partum Day 2 Subjective: no complaints, up ad lib, voiding, + flatus and breast feeding w/o difficulty  Objective: Blood pressure 108/59, pulse 83, temperature 98.2 F (36.8 C), temperature source Oral, resp. rate 18, height  (1.6 m), weight 105.235 kg (232 lb), last menstrual period 03/22/2014, SpO2 100 %, unknown if currently breastfeeding.  Physical Exam:  General: alert, cooperative and no distress Lochia: appropriate Uterine Fundus: firm perineum: healing well, no dehiscence DVT Evaluation: No evidence of DVT seen on physical exam. Negative Homan's sign. No cords or calf tenderness.   Recent Labs  01/26/15 0240 01/27/15 0558  HGB 10.5* 10.9*  HCT 32.5* 33.4*    Assessment/Plan: Discharge home and Breastfeeding   LOS: 2 days   Robertlee Rogacki [name] 01/28/2015, 10:08 AM

## 2015-01-28 NOTE — Discharge Summary (Signed)
Obstetric Discharge Summary Reason for Admission: induction of labor Prenatal Procedures: NST and ultrasound Intrapartum Procedures: spontaneous vaginal delivery Postpartum Procedures: none Complications-Operative and Postpartum: none HEMOGLOBIN  Date Value Ref Range Status  01/27/2015 10.9* 12.0 - 15.0 g/dL Final   HCT  Date Value Ref Range Status  01/27/2015 33.4* 36.0 - 46.0 % Final    Physical Exam:  General: alert, cooperative and no distress Lochia: appropriate Uterine Fundus: firm perineum: healing well, no dehiscence DVT Evaluation: No evidence of DVT seen on physical exam. Negative Homan's sign. No cords or calf tenderness.  Discharge Diagnoses: Term Pregnancy-delivered  Discharge Information: Date: 01/28/2015 Activity: pelvic rest Diet: routine Medications: PNV, Ibuprofen and Percocet Condition: stable Instructions: refer to practice specific booklet Discharge to: home   Newborn Data: Live born female  Birth Weight: 7 lb 9.5 oz (3445 g) APGAR: 8, 9  Home with mother.  Essie Hart [name] 01/28/2015, 10:11 AM

## 2015-02-12 ENCOUNTER — Emergency Department (HOSPITAL_COMMUNITY)
Admission: EM | Admit: 2015-02-12 | Discharge: 2015-02-12 | Discharge status: Against Medical Advice | Payer: Medicaid Other | Attending: Emergency Medicine | Admitting: Emergency Medicine

## 2015-02-12 ENCOUNTER — Encounter (HOSPITAL_COMMUNITY): Payer: Self-pay | Admitting: *Deleted

## 2015-02-12 DIAGNOSIS — N644 Mastodynia: Secondary | ICD-10-CM | POA: Insufficient documentation

## 2015-02-12 NOTE — ED Notes (Signed)
Called pt  twice for triage room placement  with no answer  

## 2015-02-12 NOTE — ED Notes (Signed)
Patient is here with her son.  She has irritation to her nipple but reports she is having sharp pain in her breast on the left side after nursing the baby.  She denies fever

## 2015-02-16 ENCOUNTER — Encounter (HOSPITAL_COMMUNITY): Payer: Self-pay | Admitting: Emergency Medicine

## 2015-02-16 ENCOUNTER — Emergency Department (HOSPITAL_COMMUNITY)
Admission: EM | Admit: 2015-02-16 | Discharge: 2015-02-16 | Disposition: A | Discharge status: Home / Self Care | Payer: Medicaid Other | Attending: Emergency Medicine | Admitting: Emergency Medicine

## 2015-02-16 DIAGNOSIS — H6091 Unspecified otitis externa, right ear: Secondary | ICD-10-CM | POA: Insufficient documentation

## 2015-02-16 DIAGNOSIS — Z862 Personal history of diseases of the blood and blood-forming organs and certain disorders involving the immune mechanism: Secondary | ICD-10-CM | POA: Diagnosis not present

## 2015-02-16 DIAGNOSIS — H9201 Otalgia, right ear: Secondary | ICD-10-CM | POA: Diagnosis present

## 2015-02-16 DIAGNOSIS — Z8742 Personal history of other diseases of the female genital tract: Secondary | ICD-10-CM | POA: Insufficient documentation

## 2015-02-16 DIAGNOSIS — Z872 Personal history of diseases of the skin and subcutaneous tissue: Secondary | ICD-10-CM | POA: Insufficient documentation

## 2015-02-16 DIAGNOSIS — Z8619 Personal history of other infectious and parasitic diseases: Secondary | ICD-10-CM | POA: Insufficient documentation

## 2015-02-16 MED ORDER — CIPROFLOXACIN-DEXAMETHASONE 0.3-0.1 % OT SUSP: 4.0000 [drp] | Freq: Two times a day (BID) | OTIC | Status: AC

## 2015-02-16 NOTE — ED Provider Notes (Signed)
CSN: [id number]     Arrival date & time 02/16/15  1116 History   First MD Initiated Contact with Patient 02/16/15 1144     Chief Complaint  Patient presents with  . Otalgia     (Consider location/radiation/quality/duration/timing/severity/associated sxs/prior Treatment) HPI   Victoria Olson 24 y.o.female  PCP: Default, Provider, MD  Blood pressure 123/78, pulse 88, temperature 98.5 F (36.9 C), temperature source Oral, resp. rate 18, SpO2 97 %, unknown if currently breastfeeding.  SIGNIFICANT PMH: hives, herpes, anemia, allergic reaction CHIEF COMPLAINT: right ear pain  When: 3 days ago How: patients ear started to hurt 3 nights ago, no injury, water exposure or other inciting factors Chronicity: acute Location: right ear Radiation: face and jaw Quality and severity: throbbing and feels like a blockage Treatments tried: Ibuprofen, pain relieved but return when medication wore off Alleviating factors: Ibuprofen temprarily Worsening factors: cold air  Associated Symptoms: headache and sore face/jaw, Negative ROS: Confusion, diaphoresis, fever, weakness (general or focal), change of vision,  neck pain, dysphagia, aphagia, chest pain, shortness of breath,  back pain, abdominal pains, nausea, vomiting, diarrhea, lower extremity swelling, rash.   Past Medical History  Diagnosis Date  . Hives   . Allergic reaction   . Genital herpes   . Anemia   . Hx of trichomonal vaginitis    Past Surgical History  Procedure Laterality Date  . No past surgeries     Family History  Problem Relation Age of Onset  . Hypertension Father   . Cancer Mother     uterine  . Diabetes Maternal Uncle   . Hypertension Maternal Grandfather   . Stroke Maternal Grandfather   . Hypertension Paternal Grandfather   . Heart disease Paternal Grandfather   . Stroke Paternal Grandfather   . Hearing loss Neg Hx    Social History  Substance Use Topics  . Smoking status: Never Smoker   . Smokeless  tobacco: Never Used  . Alcohol Use: No     Comment: occas   OB History    Gravida Para Term Preterm AB TAB SAB Ectopic Multiple Living   2 2 2       0 2     Review of Systems  10 Systems reviewed and are negative for acute change except as noted in the HPI.     Allergies  Hydrocodone  Home Medications   Prior to Admission medications   Medication Sig Start Date End Date Taking? Authorizing Provider  ciprofloxacin-dexamethasone (CIPRODEX) otic suspension Place 4 drops into the right ear 2 (two) times daily. 02/16/15 02/22/15  Cerrone Debold Neva Seat, PA-C  ibuprofen (ADVIL,MOTRIN) 600 MG tablet Take 1 tablet (600 mg total) by mouth every 6 (six) hours as needed. 01/28/15   Essie Hart, MD  oxyCODONE-acetaminophen (PERCOCET/ROXICET) 5-325 MG per tablet Take 1-2 tablets by mouth every 6 (six) hours as needed for moderate pain. 01/28/15   Essie Hart, MD  senna-docusate (SENOKOT-S) 8.6-50 MG per tablet Take 2 tablets by mouth at bedtime as needed for mild constipation. 01/28/15   Essie Hart, MD   BP 123/78 mmHg  Pulse 88  Temp(Src) 98.5 F (36.9 C) (Oral)  Resp 18  SpO2 97% Physical Exam  Constitutional: She appears well-developed and well-nourished. No distress.  HENT:  Head: Normocephalic and atraumatic.  Right Ear: Tympanic membrane normal. There is swelling and tenderness. Decreased hearing is noted.  Left Ear: Tympanic membrane and ear canal normal.  Nose: Nose normal.  Mouth/Throat: Uvula is midline and oropharynx is clear and  moist.  Eyes: Pupils are equal, round, and reactive to light.  Neck: Normal range of motion. Neck supple. No spinous process tenderness and no muscular tenderness present.  Cardiovascular: Normal rate and regular rhythm.   Pulmonary/Chest: Effort normal.  Abdominal: Soft.  Neurological: She is alert.  Skin: Skin is warm and dry.  Nursing note and vitals reviewed.   ED Course  Procedures (including critical care time) Labs Review Labs Reviewed - No data to  display  Imaging Review No results found. I have personally reviewed and evaluated these images and lab results as part of my medical decision-making.   EKG Interpretation None      MDM   Final diagnoses:  Otitis externa, right    Otitis externa to the right, no systemic symptoms. Normal vital signs and well appearing ciprofloxacin-dexamethasone (CIPRODEX) otic suspension Place 4 drops into the right ear 2 (two) times daily. 7.5 mL Marlon Pel, PA-C     Medications - No data to display  24 y.o.[facility]'s evaluation in the Emergency Department is complete. It has been determined that no acute conditions requiring further emergency intervention are present at this time. The patient/guardian have been advised of the diagnosis and plan. We have discussed signs and symptoms that warrant return to the ED, such as changes or worsening in symptoms.  Vital signs are stable at discharge. Filed Vitals:   02/16/15 1128  BP: 123/78  Pulse: 88  Temp: 98.5 F (36.9 C)  Resp: 18    Patient/guardian has voiced understanding and agreed to follow-up with the PCP or specialist.     Marlon Pel, PA-C 02/16/15 1610  Elwin Mocha, MD 02/16/15 1300

## 2015-02-16 NOTE — Discharge Instructions (Signed)

## 2015-02-16 NOTE — ED Notes (Signed)
Pt sts right earache x 3 days; pt sts pain into side of her face

## 2015-02-16 NOTE — ED Notes (Signed)
NAD at this time. Pt is stable and going home.  

## 2015-07-02 ENCOUNTER — Other Ambulatory Visit (HOSPITAL_COMMUNITY)
Admission: RE | Admit: 2015-07-02 | Discharge: 2015-07-02 | Disposition: A | Discharge status: Home / Self Care | Payer: Medicaid Other | Source: Ambulatory Visit | Attending: Emergency Medicine | Admitting: Emergency Medicine

## 2015-07-02 ENCOUNTER — Emergency Department (INDEPENDENT_AMBULATORY_CARE_PROVIDER_SITE_OTHER)
Admission: EM | Admit: 2015-07-02 | Discharge: 2015-07-02 | Disposition: A | Discharge status: Home / Self Care | Payer: Self-pay | Source: Home / Self Care | Attending: Emergency Medicine | Admitting: Emergency Medicine

## 2015-07-02 ENCOUNTER — Encounter (HOSPITAL_COMMUNITY): Payer: Self-pay | Admitting: Emergency Medicine

## 2015-07-02 DIAGNOSIS — R300 Dysuria: Secondary | ICD-10-CM

## 2015-07-02 LAB — POCT URINALYSIS DIP (DEVICE)
Bilirubin Urine: NEGATIVE
Glucose, UA: NEGATIVE mg/dL
Hgb urine dipstick: NEGATIVE
Ketones, ur: NEGATIVE mg/dL
NITRITE: NEGATIVE
PH: 6.5 (ref 5.0–8.0)
PROTEIN: NEGATIVE mg/dL
Specific Gravity, Urine: 1.025 (ref 1.005–1.030)
Urobilinogen, UA: 0.2 mg/dL (ref 0.0–1.0)

## 2015-07-02 NOTE — ED Notes (Signed)
C/o foul urine smell onset x1 month associated w/dark and cloudy urine... Reports she treated herself w/OTC yeast meds last month Denies vag d/c, fevers, abd pain A&O x4... No acute distress.

## 2015-07-02 NOTE — Discharge Instructions (Signed)

## 2015-07-02 NOTE — ED Provider Notes (Signed)
CSN: [id number]     Arrival date & time 07/02/15  1639 History   First MD Initiated Contact with Patient 07/02/15 1712     Chief Complaint  Patient presents with  . Urinary Tract Infection   (Consider location/radiation/quality/duration/timing/severity/associated sxs/prior Treatment) HPI Patient states that she occasionally has a foul smell to her urine and then it goes away and then comes back this is been going on for approximately one month. She states that she is not sexually active at this time she has been using skin cleansers for her acne and she is wondering if that may be the cause of her foul-smelling urine. Patient denies any fever or back pain. He states this because she is breast-feeding she drinks approximately 3 L of water a day. Past Medical History  Diagnosis Date  . Hives   . Allergic reaction   . Genital herpes   . Anemia   . Hx of trichomonal vaginitis    Past Surgical History  Procedure Laterality Date  . No past surgeries     Family History  Problem Relation Age of Onset  . Hypertension Father   . Cancer Mother     uterine  . Diabetes Maternal Uncle   . Hypertension Maternal Grandfather   . Stroke Maternal Grandfather   . Hypertension Paternal Grandfather   . Heart disease Paternal Grandfather   . Stroke Paternal Grandfather   . Hearing loss Neg Hx    Social History  Substance Use Topics  . Smoking status: Never Smoker   . Smokeless tobacco: Never Used  . Alcohol Use: No     Comment: occas   OB History    Gravida Para Term Preterm AB TAB SAB Ectopic Multiple Living   0 2     Review of Systems Please see history of present illness Allergies  Hydrocodone  Home Medications   Prior to Admission medications   Medication Sig Start Date End Date Taking? Authorizing Provider  ibuprofen (ADVIL,MOTRIN) 600 MG tablet Take 1 tablet (600 mg total) by mouth every 6 (six) hours as needed. 01/28/15   Essie Hart, MD  oxyCODONE-acetaminophen  (PERCOCET/ROXICET) 5-325 MG per tablet Take 1-2 tablets by mouth every 6 (six) hours as needed for moderate pain. 01/28/15   Essie Hart, MD  senna-docusate (SENOKOT-S) 8.6-50 MG per tablet Take 2 tablets by mouth at bedtime as needed for mild constipation. 01/28/15   Essie Hart, MD   Meds Ordered and Administered this Visit  Medications - No data to display  BP 121/83 mmHg  Pulse 86  Temp(Src) 97.9 F (36.6 C) (Oral)  SpO2 99%  LMP 06/11/2015  Breastfeeding? Yes No data found.   Physical Exam  Constitutional: She is oriented to person, place, and time. She appears well-developed and well-nourished. No distress.  HENT:  Head: Normocephalic and atraumatic.  Eyes: Conjunctivae are normal.  Pulmonary/Chest: Effort normal.  Abdominal: Soft.  No CVA tenderness or suprapubic tenderness.  Musculoskeletal: Normal range of motion.  Neurological: She is alert and oriented to person, place, and time.  Skin: Skin is warm.  Nursing note and vitals reviewed.   ED Course  Procedures (including critical care time)  Labs Review Labs Reviewed  POCT URINALYSIS DIP (DEVICE) - Abnormal; Notable for the following:    Leukocytes, UA SMALL (*)    All other components within normal limits  URINE CULTURE    Imaging Review No results found.   Visual Acuity Review  Right Eye Distance:   Left Eye Distance:   Bilateral Distance:    Right Eye Near:   Left Eye Near:    Bilateral Near:         MDM   1. Dysuria    We'll culture urine and await treatment until urine culture has returned. Patient is breast-feeding at this time.    Tharon Aquas, PA 07/02/15 1745

## 2015-07-04 LAB — URINE CULTURE
Culture: >=100000
SPECIAL REQUESTS: NORMAL

## 2015-07-29 ENCOUNTER — Telehealth (HOSPITAL_COMMUNITY): Payer: Self-pay | Admitting: Emergency Medicine

## 2015-07-29 NOTE — ED Notes (Signed)
Called pt and notified of recent lab results from visit 1/29 Pt ID'd properly... Reports feeling better and sx have subsided  Per Dr. Dayton Scrape,  No urinary symptoms (frequency, urgency, discomfort), no abd pain recorded at [facility] visit 1/29, just foul urine odor. No fever, not tachycardic.    Urine culture result meets criteria for asymptomatic bacteriuria, and may not require treatment.  If patient desires treatment, Klebsiella is sensitive to keflex and rx keflex  bid x 5d would be a reasonable choice for a breast-feeding mother. LM    Adv pt if sx are not getting better to return  Pt verb understanding.

## 2015-10-22 ENCOUNTER — Emergency Department (HOSPITAL_COMMUNITY)
Admission: EM | Admit: 2015-10-22 | Discharge: 2015-10-22 | Disposition: A | Discharge status: Home / Self Care | Payer: Medicaid Other | Attending: Emergency Medicine | Admitting: Emergency Medicine

## 2015-10-22 ENCOUNTER — Encounter (HOSPITAL_COMMUNITY): Payer: Self-pay | Admitting: Emergency Medicine

## 2015-10-22 DIAGNOSIS — L509 Urticaria, unspecified: Secondary | ICD-10-CM | POA: Insufficient documentation

## 2015-10-22 DIAGNOSIS — Z3490 Encounter for supervision of normal pregnancy, unspecified, unspecified trimester: Secondary | ICD-10-CM

## 2015-10-22 DIAGNOSIS — Z3A01 Less than 8 weeks gestation of pregnancy: Secondary | ICD-10-CM | POA: Insufficient documentation

## 2015-10-22 DIAGNOSIS — Z791 Long term (current) use of non-steroidal anti-inflammatories (NSAID): Secondary | ICD-10-CM | POA: Insufficient documentation

## 2015-10-22 DIAGNOSIS — Z79891 Long term (current) use of opiate analgesic: Secondary | ICD-10-CM | POA: Insufficient documentation

## 2015-10-22 DIAGNOSIS — O209 Hemorrhage in early pregnancy, unspecified: Secondary | ICD-10-CM | POA: Insufficient documentation

## 2015-10-22 DIAGNOSIS — O26891 Other specified pregnancy related conditions, first trimester: Secondary | ICD-10-CM

## 2015-10-22 DIAGNOSIS — Z79899 Other long term (current) drug therapy: Secondary | ICD-10-CM | POA: Insufficient documentation

## 2015-10-22 DIAGNOSIS — N898 Other specified noninflammatory disorders of vagina: Secondary | ICD-10-CM

## 2015-10-22 LAB — URINALYSIS, ROUTINE W REFLEX MICROSCOPIC
Bilirubin Urine: NEGATIVE
Glucose, UA: NEGATIVE mg/dL
Hgb urine dipstick: NEGATIVE
KETONES UR: NEGATIVE mg/dL
LEUKOCYTES UA: NEGATIVE
NITRITE: NEGATIVE
PH: 6.5 (ref 5.0–8.0)
Protein, ur: NEGATIVE mg/dL
Specific Gravity, Urine: 1.031 — ABNORMAL HIGH (ref 1.005–1.030)

## 2015-10-22 LAB — WET PREP, GENITAL
Sperm: NONE SEEN
TRICH WET PREP: NONE SEEN
YEAST WET PREP: NONE SEEN

## 2015-10-22 LAB — HCG, QUANTITATIVE, PREGNANCY: HCG, BETA CHAIN, QUANT, S: 7500 m[IU]/mL — AB (ref <5)

## 2015-10-22 LAB — POC URINE PREG, ED: PREG TEST UR: POSITIVE — AB

## 2015-10-22 MED ORDER — PREDNISONE 20 MG PO TABS
60.0000 mg | ORAL_TABLET | Freq: Once | ORAL | Status: AC
  Administered 2015-10-22: 60 mg via ORAL
  Filled 2015-10-22: qty 3

## 2015-10-22 MED ORDER — FAMOTIDINE 20 MG PO TABS
20.0000 mg | ORAL_TABLET | Freq: Once | ORAL | Status: AC
  Administered 2015-10-22: 20 mg via ORAL
  Filled 2015-10-22: qty 1

## 2015-10-22 MED ORDER — FAMOTIDINE 20 MG PO TABS: 20.0000 mg | ORAL_TABLET | Freq: Two times a day (BID) | ORAL | Status: DC

## 2015-10-22 MED ORDER — PREDNISONE 10 MG (21) PO TBPK: 10.0000 mg | ORAL_TABLET | Freq: Every day | ORAL | Status: DC

## 2015-10-22 MED ORDER — PREDNISONE 20 MG PO TABS: 40.0000 mg | ORAL_TABLET | Freq: Every day | ORAL | Status: DC

## 2015-10-22 NOTE — Discharge Instructions (Signed)
Ms. Josetta HuddleWysheka Sole,  Nice meeting you! Congratulations on your pregnancy. Please follow-up with your obstetrician. Prednisone has a peak concentration at 2 hours after taking it, and pepcid has a peak concentration of 6 hours after taking it. Return to the emergency department if you develop abdominal pain, shortness of breath, chest pain, feelings of your throat closing, new/worsening symptoms. Feel better soon!  S. Lane HackerNicole Rashidi Loh, PA-C  Allergies An allergy is an abnormal reaction to a substance by the body's defense system (immune system). Allergies can develop at any age. WHAT CAUSES ALLERGIES? An allergic reaction happens when the immune system mistakenly reacts to a normally harmless substance, called an allergen, as if it were harmful. The immune system releases antibodies to fight the substance. Antibodies eventually release a chemical called histamine into the bloodstream. The release of histamine is meant to protect the body from infection, but it also causes discomfort. An allergic reaction can be triggered by:  Eating an allergen.  Inhaling an allergen.  Touching an allergen. WHAT TYPES OF ALLERGIES ARE THERE? There are many types of allergies. Common types include:  Seasonal allergies. People with this type of allergy are usually allergic to substances that are only present during certain seasons, such as molds and pollens.  Food allergies.  Drug allergies.  Insect allergies.  Animal dander allergies. WHAT ARE SYMPTOMS OF ALLERGIES? Possible allergy symptoms include:  Swelling of the lips, face, tongue, mouth, or throat.  Sneezing, coughing, or wheezing.  Nasal congestion.  Tingling in the mouth.  Rash.  Itching.  Itchy, red, swollen areas of skin (hives).  Watery eyes.  Vomiting.  Diarrhea.  Dizziness.  Lightheadedness.  Fainting.  Trouble breathing or swallowing.  Chest tightness.  Rapid heartbeat. HOW ARE ALLERGIES DIAGNOSED? Allergies are  diagnosed with a medical and family history and one or more of the following:  Skin tests.  Blood tests.  A food diary. A food diary is a record of all the foods and drinks you have in a day and of all the symptoms you experience.  The results of an elimination diet. An elimination diet involves eliminating foods from your diet and then adding them back in one by one to find out if a certain food causes an allergic reaction. HOW ARE ALLERGIES TREATED? There is no cure for allergies, but allergic reactions can be treated with medicine. Severe reactions usually need to be treated at a hospital. HOW CAN REACTIONS BE PREVENTED? The best way to prevent an allergic reaction is by avoiding the substance you are allergic to. Allergy shots and medicines can also help prevent reactions in some cases. People with severe allergic reactions may be able to prevent a life-threatening reaction called anaphylaxis with a medicine given right after exposure to the allergen.   This information is not intended to replace advice given to you by your health care provider. Make sure you discuss any questions you have with your health care provider.   Document Released: 08/13/2002 Document Revised: 06/10/2014 Document Reviewed: 03/01/2014 Elsevier Interactive Patient Education [employer]vier Inc.

## 2015-10-22 NOTE — ED Notes (Signed)
Pt ambulates independently and with steady gait at time of discharge. Discharge instructions and follow up information reviewed with patient. No other questions or concerns voiced at this time.  

## 2015-10-22 NOTE — ED Notes (Addendum)
Multiple complaints.  C/o hives all over x 1 week.  States they are worse at night.  Reports vaginal discharge x 2 weeks.  Thought she had a yeast infection.  Used Monistat 3 day without relief of symptoms.  Also request "proof of pregnancy" documentation.  States she believes she is [redacted] weeks pregnant.  2 positive home pregnancy test.

## 2015-10-22 NOTE — ED Provider Notes (Signed)
CSN: [id number]     Arrival date & time 10/22/15  1725 History   First MD Initiated Contact with Patient 10/22/15 1907     Chief Complaint  Patient presents with  . Urticaria  . Vaginal Discharge  . Possible Pregnancy   HPI   Victoria Olson is a 25 y.o. female PMH significant for genital herpes, trichomonas vaginitis, hives/allergic reactions presenting with a 1 week history of hives and 2 week history of vaginal discharge. She states she has been worked up for hives in the past and she was on "5 different medications." She mentions that she feels a chest pressure every time her hives occur. No fevers, chills, shortness of breath, nausea, vomiting, feelings of throat closing, abdominal pain.  Past Medical History  Diagnosis Date  . Hives   . Allergic reaction   . Genital herpes   . Anemia   . Hx of trichomonal vaginitis    Past Surgical History  Procedure Laterality Date  . No past surgeries     Family History  Problem Relation Age of Onset  . Hypertension Father   . Cancer Mother     uterine  . Diabetes Maternal Uncle   . Hypertension Maternal Grandfather   . Stroke Maternal Grandfather   . Hypertension Paternal Grandfather   . Heart disease Paternal Grandfather   . Stroke Paternal Grandfather   . Hearing loss Neg Hx    Social History  Substance Use Topics  . Smoking status: Never Smoker   . Smokeless tobacco: Never Used  . Alcohol Use: Yes     Comment: occas   OB History    Gravida Para Term Preterm AB TAB SAB Ectopic Multiple Living   0 2     Review of Systems  Ten systems are reviewed and are negative for acute change except as noted in the HPI  Allergies  Hydrocodone  Home Medications   Prior to Admission medications   Medication Sig Start Date End Date Taking? Authorizing Provider  FENUGREEK PO Take 3 tablets by mouth See admin instructions. Take 3 tablets by mouth 3 times daily for 10 days, then stop for 10 days, then resume   Yes  Historical Provider, MD  ibuprofen (ADVIL,MOTRIN) 600 MG tablet Take 1 tablet (600 mg total) by mouth every 6 (six) hours as needed. Patient not taking: Reported on 10/22/2015 01/28/15   Essie Hart, MD  oxyCODONE-acetaminophen (PERCOCET/ROXICET) 5-325 MG per tablet Take 1-2 tablets by mouth every 6 (six) hours as needed for moderate pain. Patient not taking: Reported on 10/22/2015 01/28/15   Essie Hart, MD  senna-docusate (SENOKOT-S) 8.6-50 MG per tablet Take 2 tablets by mouth at bedtime as needed for mild constipation. Patient not taking: Reported on 10/22/2015 01/28/15   Essie Hart, MD   BP 99/78 mmHg  Pulse 105  Temp(Src) 98.4 F (36.9 C) (Oral)  Resp 16  SpO2 99%  LMP 09/19/2015 Physical Exam  Constitutional: She appears well-developed and well-nourished. No distress.  HENT:  Head: Normocephalic and atraumatic.  Mouth/Throat: Oropharynx is clear and moist. No oropharyngeal exudate.  Airway intact  Eyes: Conjunctivae are normal. Pupils are equal, round, and reactive to light. Right eye exhibits no discharge. Left eye exhibits no discharge. No scleral icterus.  Neck: No tracheal deviation present.  Cardiovascular: Normal rate, regular rhythm, normal heart sounds and intact distal pulses.  Exam reveals no gallop and no friction rub.   No murmur heard. Pulmonary/Chest: Effort  normal and breath sounds normal. No respiratory distress. She has no wheezes. She has no rales. She exhibits no tenderness.  Abdominal: Soft. Bowel sounds are normal. She exhibits no distension and no mass. There is no tenderness. There is no rebound and no guarding.  Genitourinary:  Chaperoned pelvic exam: normal external genitalia, vulva, vagina, cervix, uterus and adnexa.   Musculoskeletal: She exhibits no edema.  Lymphadenopathy:    She has no cervical adenopathy.  Neurological: She is alert. Coordination normal.  Skin: Skin is warm and dry. Rash noted. She is not diaphoretic. No erythema.  Diffuse hives  without erythema, drainage.   Psychiatric: She has a normal mood and affect. Her behavior is normal.  Nursing note and vitals reviewed.   ED Course  Procedures (including critical care time) Labs Review Labs Reviewed  WET PREP, GENITAL - Abnormal; Notable for the following:    Clue Cells Wet Prep HPF POC PRESENT (*)    WBC, Wet Prep HPF POC MANY (*)    All other components within normal limits  URINALYSIS, ROUTINE W REFLEX MICROSCOPIC (NOT AT [facility]) - Abnormal; Notable for the following:    Specific Gravity, Urine 1.031 (*)    All other components within normal limits  HCG, QUANTITATIVE, PREGNANCY - Abnormal; Notable for the following:    hCG, Beta Chain, Quant, S 7500 (*)    All other components within normal limits  POC URINE PREG, ED - Abnormal; Notable for the following:    Preg Test, Ur POSITIVE (*)    All other components within normal limits  RPR  HIV ANTIBODY (ROUTINE TESTING)  GC/CHLAMYDIA PROBE AMP ([facility]) NOT [facility]   MDM   Final diagnoses:  Urticaria  Pregnancy  Vaginal discharge during pregnancy in first trimester   Patient in first trimester with BV. Will hold off on treatment until she is able to see a gynecologist, as studies are conflicting. UA unremarkable. Patient re-evaluated prior to dc, is hemodynamically stable, in no respiratory distress, and denies the feeling of throat closing. Pt has been advised to take OTC benadryl & return to the ED if they have a mod-severe allergic rxn (s/s including throat closing, difficulty breathing, swelling of lips face or tongue). Pt is to follow up with their PCP. Pt is agreeable with plan & verbalizes understanding.   Melton KrebsSamantha Nicole Xinyi Batton, PA-C 11/02/15 [phone]  Vanetta MuldersScott Zackowski, MD 11/17/15 [phone]

## 2015-10-23 ENCOUNTER — Encounter (HOSPITAL_COMMUNITY): Payer: Self-pay | Admitting: *Deleted

## 2015-10-23 ENCOUNTER — Ambulatory Visit (HOSPITAL_COMMUNITY)
Admission: EM | Admit: 2015-10-23 | Discharge: 2015-10-23 | Disposition: A | Discharge status: Home / Self Care | Payer: Self-pay | Attending: Family Medicine | Admitting: Family Medicine

## 2015-10-23 DIAGNOSIS — B9689 Other specified bacterial agents as the cause of diseases classified elsewhere: Secondary | ICD-10-CM

## 2015-10-23 DIAGNOSIS — B999 Unspecified infectious disease: Secondary | ICD-10-CM

## 2015-10-23 DIAGNOSIS — N760 Acute vaginitis: Secondary | ICD-10-CM

## 2015-10-23 DIAGNOSIS — O98911 Unspecified maternal infectious and parasitic disease complicating pregnancy, first trimester: Secondary | ICD-10-CM

## 2015-10-23 DIAGNOSIS — A499 Bacterial infection, unspecified: Secondary | ICD-10-CM

## 2015-10-23 LAB — HIV ANTIBODY (ROUTINE TESTING W REFLEX): HIV Screen 4th Generation wRfx: NONREACTIVE

## 2015-10-23 LAB — GC/CHLAMYDIA PROBE AMP (~~LOC~~) NOT AT ARMC
CHLAMYDIA, DNA PROBE: NEGATIVE
NEISSERIA GONORRHEA: NEGATIVE

## 2015-10-23 LAB — RPR: RPR Ser Ql: NONREACTIVE

## 2015-10-23 MED ORDER — METRONIDAZOLE 500 MG PO TABS: 500.0000 mg | ORAL_TABLET | Freq: Two times a day (BID) | ORAL | Status: DC

## 2015-10-23 NOTE — ED Provider Notes (Signed)
CSN: [id number]     Arrival date & time 10/23/15  1757 History   First MD Initiated Contact with Patient 10/23/15 1833     Chief Complaint  Patient presents with  . Vaginal Discharge   (Consider location/radiation/quality/duration/timing/severity/associated sxs/prior Treatment) HPI Patient is here because she is currently pregnant was diagnosed with bacterial vaginosis but was told by the emergency department provider didn't needed to see someone else for treatment as she was pregnant. Patient states that this time that her plan is to terminate pregnancy but her concern is that she is breast-feeding and wants to make sure that the medication will not affect the breast-feeding child. She denies having any pain. She does state that she continues to have vaginal discharge. Patient states that she is approximately [redacted] weeks pregnant based on hCG report from yesterday done in the emergency department. Sensation states that she plans to call to set up an appointment for termination this week. Past Medical History  Diagnosis Date  . Hives   . Allergic reaction   . Genital herpes   . Anemia   . Hx of trichomonal vaginitis    Past Surgical History  Procedure Laterality Date  . No past surgeries     Family History  Problem Relation Age of Onset  . Hypertension Father   . Cancer Mother     uterine  . Diabetes Maternal Uncle   . Hypertension Maternal Grandfather   . Stroke Maternal Grandfather   . Hypertension Paternal Grandfather   . Heart disease Paternal Grandfather   . Stroke Paternal Grandfather   . Hearing loss Neg Hx    Social History  Substance Use Topics  . Smoking status: Never Smoker   . Smokeless tobacco: Never Used  . Alcohol Use: Yes     Comment: occas   OB History    Gravida Para Term Preterm AB TAB SAB Ectopic Multiple Living   0 2     Review of Systems  Denies: HEADACHE, NAUSEA, ABDOMINAL PAIN, CHEST PAIN, CONGESTION, DYSURIA, SHORTNESS OF  BREATH  Allergies  Hydrocodone  Home Medications   Prior to Admission medications   Medication Sig Start Date End Date Taking? Authorizing Provider  famotidine (PEPCID) 20 MG tablet Take 1 tablet (20 mg total) by mouth 2 (two) times daily. 10/22/15   Melton Krebs, PA-C  FENUGREEK PO Take 3 tablets by mouth See admin instructions. Take 3 tablets by mouth 3 times daily for 10 days, then stop for 10 days, then resume    Historical Provider, MD  ibuprofen (ADVIL,MOTRIN) 600 MG tablet Take 1 tablet (600 mg total) by mouth every 6 (six) hours as needed. Patient not taking: Reported on 10/22/2015 01/28/15   Essie Hart, MD  metroNIDAZOLE (FLAGYL) 500 MG tablet Take 1 tablet (500 mg total) by mouth 2 (two) times daily. 10/23/15   Tharon Aquas, PA  oxyCODONE-acetaminophen (PERCOCET/ROXICET) 5-325 MG per tablet Take 1-2 tablets by mouth every 6 (six) hours as needed for moderate pain. Patient not taking: Reported on 10/22/2015 01/28/15   Essie Hart, MD  predniSONE (DELTASONE) 20 MG tablet Take 2 tablets (40 mg total) by mouth daily. 10/22/15   Melton Krebs, PA-C  senna-docusate (SENOKOT-S) 8.6-50 MG per tablet Take 2 tablets by mouth at bedtime as needed for mild constipation. Patient not taking: Reported on 10/22/2015 01/28/15   Essie Hart, MD   Meds Ordered and Administered this Visit  Medications - No data  to display  BP 116/69 mmHg  Pulse 97  Temp(Src) 98.1 F (36.7 C) (Oral)  Resp 16  SpO2 99%  LMP 09/19/2015  Breastfeeding? Unknown No data found.   Physical Exam NURSES NOTES AND VITAL SIGNS REVIEWED. CONSTITUTIONAL: Well developed, well nourished, no acute distress HEENT: normocephalic, atraumatic EYES: Conjunctiva normal NECK:normal ROM, supple, no adenopathy PULMONARY:No respiratory distress, normal effort ABDOMINAL: Soft, ND, NT BS+, No CVAT MUSCULOSKELETAL: Normal ROM of all extremities,  SKIN: warm and dry without rash PSYCHIATRIC: Mood and affect, behavior are  normal PT DECLINED PELVIC EXAM AS SHE HAD ONE YESTERDAY ED Course  Procedures (including critical care time)  Labs Review Labs Reviewed - No data to display  Imaging Review No results found.   Visual Acuity Review  Right Eye Distance:   Left Eye Distance:   Bilateral Distance:    Right Eye Near:   Left Eye Near:    Bilateral Near:       Total Visit Time:20 MINUTES "GREATER THAN 50% WAS SPENT IN COUNSELING AND COORDINATION OF CARE WITH THE PATIENT" DISCUSSION OF TERMINATION OF PREGNANCY, TREATMENT OF BV WHILE BREAST FEEDING. CONSULTATION WITH ANOTHER PROVIDER, REVIEWING LAB REPORTS ON PREVIOUS VISIT. Discussion with nurse practitioner at [facility] and was advised that Flagyl is safe for breast-feeding.  MDM   1. BV (bacterial vaginosis)   2. Pregnancy and infectious disease in first trimester     Patient is reassured that there are no issues that require transfer to higher level of care at this time or additional tests. Patient is advised to continue home symptomatic treatment. Patient is advised that if there are new or worsening symptoms to attend the emergency department, contact primary care provider, or return to UC. Instructions of care provided discharged home in stable condition.    THIS NOTE WAS GENERATED USING A VOICE RECOGNITION SOFTWARE PROGRAM. ALL REASONABLE EFFORTS  WERE MADE TO PROOFREAD THIS DOCUMENT FOR ACCURACY.  I have verbally reviewed the discharge instructions with the patient. A printed AVS was given to the patient.  All questions were answered prior to discharge.      Tharon AquasFrank C Patrick, PA 10/23/15 1924  Tharon AquasFrank C Patrick, [place]PA 10/23/15 1925

## 2015-10-23 NOTE — ED Notes (Signed)
Patient states she was diagnosed with bacterial vaginosis and states that she was not prescribed any medication from the ED, pelvic was completed. Patient went to ED for pregnancy test, hives, and vaginal discharge. Patient is taking pepcid and prednisone for hives on back, arms, and thighs....she has not picked up these prescriptions yet.

## 2015-10-23 NOTE — Discharge Instructions (Signed)
Bacterial Vaginosis Bacterial vaginosis is a vaginal infection that occurs when the normal balance of bacteria in the vagina is disrupted. It results from an overgrowth of certain bacteria. This is the most common vaginal infection in women of childbearing age. Treatment is important to prevent complications, especially in pregnant women, as it can cause a premature delivery. CAUSES  Bacterial vaginosis is caused by an increase in harmful bacteria that are normally present in smaller amounts in the vagina. Several different kinds of bacteria can cause bacterial vaginosis. However, the reason that the condition develops is not fully understood. RISK FACTORS Certain activities or behaviors can put you at an increased risk of developing bacterial vaginosis, including:  Having a new sex partner or multiple sex partners.  Douching.  Using an intrauterine device (IUD) for contraception. Women do not get bacterial vaginosis from toilet seats, bedding, swimming pools, or contact with objects around them. SIGNS AND SYMPTOMS  Some women with bacterial vaginosis have no signs or symptoms. Common symptoms include:  Grey vaginal discharge.  A fishlike odor with discharge, especially after sexual intercourse.  Itching or burning of the vagina and vulva.  Burning or pain with urination. DIAGNOSIS  Your health care provider will take a medical history and examine the vagina for signs of bacterial vaginosis. A sample of vaginal fluid may be taken. Your health care provider will look at this sample under a microscope to check for bacteria and abnormal cells. A vaginal pH test may also be done.  TREATMENT  Bacterial vaginosis may be treated with antibiotic medicines. These may be given in the form of a pill or a vaginal cream. A second round of antibiotics may be prescribed if the condition comes back after treatment. Because bacterial vaginosis increases your risk for sexually transmitted diseases, getting  treated can help reduce your risk for chlamydia, gonorrhea, HIV, and herpes. HOME CARE INSTRUCTIONS   Only take over-the-counter or prescription medicines as directed by your health care provider.  If antibiotic medicine was prescribed, take it as directed. Make sure you finish it even if you start to feel better.  Tell all sexual partners that you have a vaginal infection. They should see their health care provider and be treated if they have problems, such as a mild rash or itching.  During treatment, it is important that you follow these instructions:  Avoid sexual activity or use condoms correctly.  Do not douche.  Avoid alcohol as directed by your health care provider.  Avoid breastfeeding as directed by your health care provider. SEEK MEDICAL CARE IF:   Your symptoms are not improving after 3 days of treatment.  You have increased discharge or pain.  You have a fever. MAKE SURE YOU:   Understand these instructions.  Will watch your condition.  Will get help right away if you are not doing well or get worse. FOR MORE INFORMATION  Centers for Disease Control and Prevention, Division of STD Prevention: SolutionApps.co.za American Sexual Health Association (ASHA): www.ashastd.org    This information is not intended to replace advice given to you by your health care provider. Make sure you discuss any questions you have with your health care provider.   Document Released: 05/20/2005 Document Revised: 06/10/2014 Document Reviewed: 12/30/2012 Elsevier Interactive Patient Education 2016 [employer] Inc. Bacterial Vaginosis Bacterial vaginosis is a vaginal infection that occurs when the normal balance of bacteria in the vagina is disrupted. It results from an overgrowth of certain bacteria. This is the most common vaginal infection in  women of childbearing age. Treatment is important to prevent complications, especially in pregnant women, as it can cause a premature delivery. CAUSES   Bacterial vaginosis is caused by an increase in harmful bacteria that are normally present in smaller amounts in the vagina. Several different kinds of bacteria can cause bacterial vaginosis. However, the reason that the condition develops is not fully understood. RISK FACTORS Certain activities or behaviors can put you at an increased risk of developing bacterial vaginosis, including:  Having a new sex partner or multiple sex partners.  Douching.  Using an intrauterine device (IUD) for contraception. Women do not get bacterial vaginosis from toilet seats, bedding, swimming pools, or contact with objects around them. SIGNS AND SYMPTOMS  Some women with bacterial vaginosis have no signs or symptoms. Common symptoms include:  Grey vaginal discharge.  A fishlike odor with discharge, especially after sexual intercourse.  Itching or burning of the vagina and vulva.  Burning or pain with urination. DIAGNOSIS  Your health care provider will take a medical history and examine the vagina for signs of bacterial vaginosis. A sample of vaginal fluid may be taken. Your health care provider will look at this sample under a microscope to check for bacteria and abnormal cells. A vaginal pH test may also be done.  TREATMENT  Bacterial vaginosis may be treated with antibiotic medicines. These may be given in the form of a pill or a vaginal cream. A second round of antibiotics may be prescribed if the condition comes back after treatment. Because bacterial vaginosis increases your risk for sexually transmitted diseases, getting treated can help reduce your risk for chlamydia, gonorrhea, HIV, and herpes. HOME CARE INSTRUCTIONS   Only take over-the-counter or prescription medicines as directed by your health care provider.  If antibiotic medicine was prescribed, take it as directed. Make sure you finish it even if you start to feel better.  Tell all sexual partners that you have a vaginal infection. They  should see their health care provider and be treated if they have problems, such as a mild rash or itching.  During treatment, it is important that you follow these instructions:  Avoid sexual activity or use condoms correctly.  Do not douche.  Avoid alcohol as directed by your health care provider.  Avoid breastfeeding as directed by your health care provider. SEEK MEDICAL CARE IF:   Your symptoms are not improving after 3 days of treatment.  You have increased discharge or pain.  You have a fever. MAKE SURE YOU:   Understand these instructions.  Will watch your condition.  Will get help right away if you are not doing well or get worse. FOR MORE INFORMATION  Centers for Disease Control and Prevention, Division of STD Prevention: SolutionApps.co.zawww.cdc.gov/std American Sexual Health Association (ASHA): www.ashastd.org    This information is not intended to replace advice given to you by your health care provider. Make sure you discuss any questions you have with your health care provider.   Document Released: 05/20/2005 Document Revised: 06/10/2014 Document Reviewed: 12/30/2012 Elsevier Interactive Patient Education 2016 [employer] Breastfeeding During Pregnancy Deciding whether to continue breastfeeding during pregnancy is an Paramedicindividual choice. If you are well-nourished, you should have no difficulty nursing your child while continuing to provide for your unborn baby. However, mothers who have problems during pregnancy may be advised to stop breastfeeding. DECIDING WHETHER TO CONTINUE TO BREASTFEED When deciding whether to continue breastfeeding, consider:  The age of the nursing child and his or her physical and  emotional needs.  Whether you are experiencing discomfort, such as nipple soreness.  Any health concerns related to pregnancy. It may not be safe to continue breastfeeding if you have certain problems, such as:  Uterine pain or bleeding.  A history of preterm labor and  delivery.  Problems gaining weight or losing weight during pregnancy. Some women choose to wean due to:   Nipple soreness.  Nausea.  Finding that their growing stomachs make breastfeeding uncomfortable.  Getting tired easily. CONTINUING TO BREASTFEED If you choose to continue breastfeeding, it important to remain flexible and avoid having rigid expectations of how breastfeeding will go. Your needs and your nursing child's needs are likely to change as your pregnancy progresses. Pregnancy changes the flavor of your milk, and your child may decide to stop breastfeeding on his or her own. While breastfeeding, it is important that you:  Eat enough calories to gain adequate weight.  Eat nutritious foods.  Discuss your decision to continue breastfeeding with your baby's health care provider.  Keep track of your nursing child's weight. If your nursing baby is younger than twelve months, the normal decrease of your milk supply that happens during pregnancy could prevent your baby from getting all the milk he or she needs.   This information is not intended to replace advice given to you by your health care provider. Make sure you discuss any questions you have with your health care provider.   Document Released: 09/17/2004 Document Revised: 05/25/2013 Document Reviewed: 03/01/2013 Elsevier Interactive Patient Education [employer]

## 2016-06-21 ENCOUNTER — Other Ambulatory Visit: Payer: Self-pay | Admitting: Obstetrics & Gynecology

## 2016-06-25 LAB — CYTOLOGY - PAP

## 2016-07-17 ENCOUNTER — Encounter: Payer: Self-pay | Admitting: Family Medicine

## 2016-07-17 ENCOUNTER — Ambulatory Visit (INDEPENDENT_AMBULATORY_CARE_PROVIDER_SITE_OTHER): Payer: BLUE CROSS/BLUE SHIELD | Admitting: *Deleted

## 2016-07-17 DIAGNOSIS — Z3200 Encounter for pregnancy test, result unknown: Secondary | ICD-10-CM

## 2016-07-17 DIAGNOSIS — Z3201 Encounter for pregnancy test, result positive: Secondary | ICD-10-CM | POA: Diagnosis not present

## 2016-07-17 LAB — POCT PREGNANCY, URINE: Preg Test, Ur: POSITIVE — AB

## 2016-07-17 NOTE — Progress Notes (Signed)
+  UPT today.  Unsure LMP 05/21/16.  EDD 02/25/17.  Medication reconciliation completed. Pt is unsure where she will obtain prenatal care.

## 2016-09-18 ENCOUNTER — Other Ambulatory Visit: Payer: Self-pay | Admitting: Obstetrics & Gynecology

## 2017-02-07 ENCOUNTER — Ambulatory Visit (HOSPITAL_COMMUNITY)
Admission: EM | Admit: 2017-02-07 | Discharge: 2017-02-07 | Disposition: A | Discharge status: Other Acute Inpatient Hospital | Payer: BLUE CROSS/BLUE SHIELD

## 2017-02-07 ENCOUNTER — Ambulatory Visit: Payer: Self-pay

## 2017-02-07 ENCOUNTER — Other Ambulatory Visit: Payer: Self-pay | Admitting: Occupational Medicine

## 2017-02-07 DIAGNOSIS — M542 Cervicalgia: Secondary | ICD-10-CM

## 2017-06-03 DIAGNOSIS — M329 Systemic lupus erythematosus, unspecified: Secondary | ICD-10-CM

## 2017-06-03 DIAGNOSIS — IMO0002 Reserved for concepts with insufficient information to code with codable children: Secondary | ICD-10-CM

## 2017-06-03 HISTORY — DX: Systemic lupus erythematosus, unspecified: M32.9

## 2017-06-03 HISTORY — DX: Reserved for concepts with insufficient information to code with codable children: IMO0002

## 2018-01-14 ENCOUNTER — Ambulatory Visit (HOSPITAL_COMMUNITY)
Admission: EM | Admit: 2018-01-14 | Discharge: 2018-01-14 | Disposition: A | Discharge status: Home / Self Care | Payer: BLUE CROSS/BLUE SHIELD | Attending: Family Medicine | Admitting: Family Medicine

## 2018-01-14 ENCOUNTER — Encounter (HOSPITAL_COMMUNITY): Payer: Self-pay

## 2018-01-14 ENCOUNTER — Encounter: Payer: Self-pay | Admitting: Internal Medicine

## 2018-01-14 ENCOUNTER — Ambulatory Visit: Payer: Self-pay | Admitting: Family Medicine

## 2018-01-14 DIAGNOSIS — L509 Urticaria, unspecified: Secondary | ICD-10-CM

## 2018-01-14 DIAGNOSIS — R0789 Other chest pain: Secondary | ICD-10-CM

## 2018-01-14 MED ORDER — PREDNISONE 10 MG (21) PO TBPK: ORAL_TABLET | Freq: Every day | ORAL | 0 refills | Status: DC

## 2018-01-14 NOTE — ED Triage Notes (Signed)
Hives and chest discomfort. 1 week

## 2018-01-14 NOTE — Discharge Instructions (Signed)
You have been seen at [facility] today for chest discomfort. Your evaluation today was not suggestive of any emergent condition requiring medical intervention at this time. Your EKG did not show any worrisome changes. However, some medical problems make take more time to appear. Therefore, it is important for you to watch for any new symptoms or worsening of your current condition.  Please proceed to the Emergency Department immediately should you feel worse in any way or have any of the following symptoms: increasing or different chest pain, pain that spreads to your arm, neck, jaw, back or abdomen, shortness of breath, or nausea and vomiting.

## 2018-01-15 ENCOUNTER — Ambulatory Visit: Payer: Self-pay | Admitting: Family Medicine

## 2018-01-16 LAB — BASIC METABOLIC PANEL
BUN: 10 (ref 4–21)
Creatinine: 0.7 (ref 0.5–1.1)
POTASSIUM: 4.2 (ref 3.4–5.3)
SODIUM: 140 (ref 137–147)

## 2018-01-16 LAB — TSH: TSH: 2.36 (ref 0.41–5.90)

## 2018-01-16 LAB — HEPATIC FUNCTION PANEL
ALT: 8 (ref 7–35)
AST: 11 — AB (ref 13–35)

## 2018-01-21 ENCOUNTER — Encounter: Payer: Self-pay | Admitting: Family Medicine

## 2018-01-21 ENCOUNTER — Ambulatory Visit: Payer: BLUE CROSS/BLUE SHIELD | Admitting: Family Medicine

## 2018-01-21 VITALS — BP 118/80 | HR 94 | Temp 98.4°F | Ht 63.0 in | Wt 204.0 lb

## 2018-01-21 DIAGNOSIS — L509 Urticaria, unspecified: Secondary | ICD-10-CM | POA: Diagnosis not present

## 2018-01-21 DIAGNOSIS — E079 Disorder of thyroid, unspecified: Secondary | ICD-10-CM

## 2018-01-21 DIAGNOSIS — Z7689 Persons encountering health services in other specified circumstances: Secondary | ICD-10-CM | POA: Diagnosis not present

## 2018-01-21 MED ORDER — MONTELUKAST SODIUM 10 MG PO TABS: 10.0000 mg | ORAL_TABLET | Freq: Every day | ORAL | 4 refills | Status: DC

## 2018-01-21 MED ORDER — RANITIDINE HCL 150 MG PO TABS: 150.0000 mg | ORAL_TABLET | Freq: Every day | ORAL | 3 refills | Status: DC

## 2018-01-21 MED ORDER — CETIRIZINE HCL 10 MG PO TABS: 10.0000 mg | ORAL_TABLET | Freq: Every day | ORAL | 11 refills | Status: DC

## 2018-01-21 NOTE — ED Provider Notes (Signed)
[facility]   [id number] 01/14/18 Arrival Time: 1749  ASSESSMENT & PLAN:  1. Hives   2. Chest discomfort     Meds ordered this encounter  Medications  . predniSONE (STERAPRED UNI-PAK 21 TAB) 10 MG (21) TBPK tablet    Sig: Take by mouth daily. Take as directed.    Dispense:  21 tablet    Refill:  0    Follow up here if not seeing improvement within 48 hours, sooner if needed.  Chest pain precautions given. Reviewed expectations re: course of current medical issues. Questions answered. Outlined signs and symptoms indicating need for more acute intervention. Patient verbalized understanding. After Visit Summary given.   SUBJECTIVE:  Victoria Olson is a 27 y.o. female who presents with complaint of:  Chest discomfort. Onset gradual, 1 week ago, with resolved course since that time. Describes discomfort as intermittent and dull in nature. Discomfort does not radiate. Rates discomfort as a 2/10 in intensity. No SOB. Associated symptoms: none. Aggravating factors: none. Alleviating factors: none.  Cardiac risk factors: none.  Also reports itchy rash over body. Few days. No new exposures. No n/v. Afebrile. OTC treatment: none reported. No new medications.  Social History   Tobacco Use  Smoking Status Never Smoker  Smokeless Tobacco Never Used    ROS: As per HPI.   OBJECTIVE:  Vitals:   01/14/18 1817 01/14/18 1818  BP: (!) 116/103   Pulse: 95   Resp: 18   Temp: 98.5 F (36.9 C)   TempSrc: Oral   SpO2: 99%   Weight:  90.7 kg    General appearance: alert; no distress Eyes: PERRLA; EOMI; conjunctiva normal HENT: normocephalic; atraumatic Neck: supple Lungs: clear to auscultation bilaterally Heart: regular rate and rhythm Chest Wall: non-tender Abdomen: soft, non-tender; bowel sounds normal; no masses or organomegaly; no guarding or rebound tenderness Extremities: no cyanosis or edema; symmetrical with no gross deformities Skin: warm and dry;  smooth, slightly elevated and erythematous plaques of variable size over her torso and extremities mainly Psychological: alert and cooperative; normal mood and affect  ECG: Orders placed or performed during the hospital encounter of 01/14/18  . ED EKG  . ED EKG    Labs: Results for orders placed or performed in visit on 07/17/16  Pregnancy, urine POC  Result Value Ref Range   Preg Test, Ur POSITIVE (A) NEGATIVE    Allergies  Allergen Reactions  . Hydrocodone Hives    Past Medical History:  Diagnosis Date  . Allergic reaction   . Anemia   . Genital herpes   . Hives   . Hx of trichomonal vaginitis    Social History   Socioeconomic History  . Marital status: Single    Spouse name: Not on file  . Number of children: Not on file  . Years of education: Not on file  . Highest education level: Not on file  Occupational History  . Not on file  Social Needs  . Financial resource strain: Not on file  . Food insecurity:    Worry: Not on file    Inability: Not on file  . Transportation needs:    Medical: Not on file    Non-medical: Not on file  Tobacco Use  . Smoking status: Never Smoker  . Smokeless tobacco: Never Used  Substance and Sexual Activity  . Alcohol use: Yes    Comment: occas  . Drug use: No  . Sexual activity: Yes    Birth control/protection: Pill  Lifestyle  .  Physical activity:    Days per week: Not on file    Minutes per session: Not on file  . Stress: Not on file  Relationships  . Social connections:    Talks on phone: Not on file    Gets together: Not on file    Attends religious service: Not on file    Active member of club or organization: Not on file    Attends meetings of clubs or organizations: Not on file    Relationship status: Not on file  . Intimate partner violence:    Fear of current or ex partner: Not on file    Emotionally abused: Not on file    Physically abused: Not on file    Forced sexual activity: Not on file  Other  Topics Concern  . Not on file  Social History Narrative  . Not on file   Family History  Problem Relation Age of Onset  . Hypertension Father   . Cancer Mother        uterine  . Diabetes Maternal Uncle   . Hypertension Maternal Grandfather   . Stroke Maternal Grandfather   . Hypertension Paternal Grandfather   . Heart disease Paternal Grandfather   . Stroke Paternal Grandfather   . Hearing loss Neg Hx    Past Surgical History:  Procedure Laterality Date  . NO PAST SURGERIES       Mardella LaymanHagler, Raianna Slight, MD 01/21/18 [phone]

## 2018-01-21 NOTE — Patient Instructions (Signed)
Hives  Hives (urticaria) are itchy, red, swollen areas on your skin. Hives can appear on any part of your body and can vary in size. They can be as small as the tip of a pen or much larger. Hives often fade within 24 hours (acute hives). In other cases, new hives appear after old ones fade. This cycle can continue for several days or weeks (chronic hives).  Hives result from your body's reaction to an irritant or to something that you are allergic to (trigger). When you are exposed to a trigger, your body releases a chemical (histamine) that causes redness, itching, and swelling. You can get hives immediately after being exposed to a trigger or hours later.  Hives do not spread from person to person (are not contagious). Your hives may get worse with scratching, exercise, and emotional stress.  What are the causes?  Causes of this condition include:   Allergies to certain foods or ingredients.   Insect bites or stings.   Exposure to pollen or pet dander.   Contact with latex or chemicals.   Spending time in sunlight, heat, or cold (exposure).   Exercise.   Stress.    You can also get hives from some medical conditions and treatments. These include:   Viruses, including the common cold.   Bacterial infections, such as urinary tract infections and strep throat.   Disorders such as vasculitis, lupus, or thyroid disease.   Certain medications.   Allergy shots.   Blood transfusions.    Sometimes, the cause of hives is not known (idiopathic hives).  What increases the risk?  This condition is more likely to develop in:   Women.   People who have food allergies, especially to citrus fruits, milk, eggs, peanuts, tree nuts, or shellfish.   People who are allergic to:  ? Medicines.  ? Latex.  ? Insects.  ? Animals.  ? Pollen.   People who have certain medical conditions, includinglupus or thyroid disease.    What are the signs or symptoms?  The main symptom of this condition is raised, itchyred or white  bumps or patches on your skin. These areas may:   Become large and swollen (welts).   Change in shape and location, quickly and repeatedly.   Be separate hives or connect over a large area of skin.   Sting or become painful.   Turn white when pressed in the center (blanch).    In severe cases, yourhands, feet, and face may also become swollen. This may occur if hives develop deeper in your skin.  How is this diagnosed?  This condition is diagnosed based on your symptoms, medical history, and physical exam. Your skin, urine, or blood may be tested to find out what is causing your hives and to rule out other health issues. Your health care provider may also remove a small sample of skin from the affected area and examine it under a microscope (biopsy).  How is this treated?  Treatment depends on the severity of your condition. Your health care provider may recommend using cool, wet cloths (cool compresses) or taking cool showers to relieve itching. Hives are sometimes treated with medicines, including:   Antihistamines.   Corticosteroids.   Antibiotics.   An injectable medicine (omalizumab). Your health care provider may prescribe this if you have chronic idiopathic hives and you continue to have symptoms even after treatment with antihistamines.    Severe cases may require an emergency injection of adrenaline (epinephrine) to prevent a   life-threatening allergic reaction (anaphylaxis).  Follow these instructions at home:  Medicines   Take or apply over-the-counter and prescription medicines only as told by your health care provider.   If you were prescribed an antibiotic medicine, use it as told by your health care provider. Do not stop taking the antibiotic even if you start to feel better.  Skin Care   Apply cool compresses to the affected areas.   Do not scratch or rub your skin.  General instructions   Do not take hot showers or baths. This can make itching worse.   Do not wear tight-fitting  clothing.   Use sunscreen and wear protective clothing when you are outside.   Avoid any substances that cause your hives. Keep a journal to help you track what causes your hives. Write down:  ? What medicines you take.  ? What you eat and drink.  ? What products you use on your skin.   Keep all follow-up visits as told by your health care provider. This is important.  Contact a health care provider if:   Your symptoms are not controlled with medicine.   Your joints are painful or swollen.  Get help right away if:   You have a fever.   You have pain in your abdomen.   Your tongue or lips are swollen.   Your eyelids are swollen.   Your chest or throat feels tight.   You have trouble breathing or swallowing.  These symptoms may represent a serious problem that is an emergency. Do not wait to see if the symptoms will go away. Get medical help right away. Call your local emergency services (911 in the [place] Do not drive yourself to the hospital.  This information is not intended to replace advice given to you by your health care provider. Make sure you discuss any questions you have with your health care provider.  Document Released: 05/20/2005 Document Revised: 10/18/2015 Document Reviewed: 03/08/2015  Elsevier Interactive Patient Education  2018 Elsevier Inc.

## 2018-01-21 NOTE — Progress Notes (Signed)
Patient presents to clinic today to establish care.  SUBJECTIVE: PMH: Patient is a 27 year old female with past medical history significant for urticaria.  Patient states she was recently told she had lupus and thyroid problems by her OB/Gyn office.  Pt has a bill for ANA, TSH, and RA factor, no lab values listed.  Urticaria: -states has a h/o urticaria and chest pain since she was a teenager. -In the past seen by an allergist.  Allergy testing was positive for dust and horses. -In 2016 pt was on cetirizine 10 mg daily, hydroxyzine 25 mg, ranitidine 150 mg, montelukast 10 mg. -notes hives improved after the birth of her daughter, but returned.  -notes midsternal chest pain/pressure prior to hives appearing, typically at night -pt taking benadryl BID -was given rx for prednisone on 01/14/18 in the ED.  Allergies: Hydrocodone-bumps on face  Social history: Patient is single.  She is a mental health specialist.  Patient has a daughter and a son.  Patient denies alcohol, tobacco, drug use.  Health Maintenance: PAP --January 2019   Past Medical History:  Diagnosis Date  . Allergic reaction   . Anemia   . Genital herpes   . Hives   . Hx of trichomonal vaginitis   . Lupus (HCC) 2019    Past Surgical History:  Procedure Laterality Date  . NO PAST SURGERIES      Current Outpatient Medications on File Prior to Visit  Medication Sig Dispense Refill  . predniSONE (STERAPRED UNI-PAK 21 TAB) 10 MG (21) TBPK tablet Take by mouth daily. Take as directed. 21 tablet 0   No current facility-administered medications on file prior to visit.     Allergies  Allergen Reactions  . Hydrocodone Hives    Family History  Problem Relation Age of Onset  . Hypertension Father   . Cancer Mother        uterine  . Diabetes Maternal Uncle   . Hypertension Maternal Grandfather   . Stroke Maternal Grandfather   . Hypertension Paternal Grandfather   . Heart disease Paternal Grandfather   .  Stroke Paternal Grandfather   . Hearing loss Neg Hx     Social History   Socioeconomic History  . Marital status: Single    Spouse name: Not on file  . Number of children: Not on file  . Years of education: Not on file  . Highest education level: Not on file  Occupational History  . Not on file  Social Needs  . Financial resource strain: Not on file  . Food insecurity:    Worry: Not on file    Inability: Not on file  . Transportation needs:    Medical: Not on file    Non-medical: Not on file  Tobacco Use  . Smoking status: Never Smoker  . Smokeless tobacco: Never Used  Substance and Sexual Activity  . Alcohol use: Yes    Comment: occas  . Drug use: No  . Sexual activity: Yes    Birth control/protection: Pill  Lifestyle  . Physical activity:    Days per week: Not on file    Minutes per session: Not on file  . Stress: Not on file  Relationships  . Social connections:    Talks on phone: Not on file    Gets together: Not on file    Attends religious service: Not on file    Active member of club or organization: Not on file    Attends meetings of clubs or  organizations: Not on file    Relationship status: Not on file  . Intimate partner violence:    Fear of current or ex partner: Not on file    Emotionally abused: Not on file    Physically abused: Not on file    Forced sexual activity: Not on file  Other Topics Concern  . Not on file  Social History Narrative  . Not on file    ROS General: Denies fever, chills, night sweats, changes in weight, changes in appetite HEENT: Denies headaches, ear pain, changes in vision, rhinorrhea, sore throat CV: Denies CP, palpitations, SOB, orthopnea Pulm: Denies SOB, cough, wheezing GI: Denies abdominal pain, nausea, vomiting, diarrhea, constipation GU: Denies dysuria, hematuria, frequency, vaginal discharge Msk: Denies muscle cramps, joint pains Neuro: Denies weakness, numbness, tingling Skin: Denies rashes, bruising  +  hives Psych: Denies depression, anxiety, hallucinations  BP 118/80 (BP Location: Left Arm, Patient Position: Sitting, Cuff Size: Large)   Pulse 94   Temp 98.4 F (36.9 C) (Oral)   Ht 5\' 3"  (1.6 m)   Wt 204 lb (92.5 kg)   LMP 01/11/2018 (Exact Date)   SpO2 98%   BMI 36.14 kg/m   Physical Exam Gen. Pleasant, well developed, well-nourished, in NAD HEENT - [place]/AT, PERRL, no scleral icterus, no nasal drainage, pharynx without erythema or exudate. Lungs: no use of accessory muscles, CTAB, no wheezes, rales or rhonchi Cardiovascular: RRR, No r/g/m, no peripheral edema Abdomen: BS present, soft, nontender, nondistended Neuro:  A&Ox3, CN II-XII intact, normal gait Skin:  Warm, dry, intact, no lesions   No results found for this or any previous visit (from the past 2160 hour(s)).  Assessment/Plan: Urticaria  -pt to d/c use of benadryl BID. -will restart previous meds as may be a reflux component to pt's symptoms  - Plan: cetirizine (ZYRTEC) 10 MG tablet, ranitidine (ZANTAC) 150 MG tablet, montelukast (SINGULAIR) 10 MG tablet -Ambulatory referral to Allergy  Thyroid disorder -no labs available for review -will defer to OB/Gyn  Encounter to establish care -We reviewed the PMH, PSH, FH, SH, Meds and Allergies. -We provided refills for any medications we will prescribe as needed. -We addressed current concerns per orders and patient instructions. -We have asked for records for pertinent exams, studies, vaccines and notes from previous providers. -We have advised patient to follow up per instructions below.  F/u prn  Abbe AmsterdamShannon Destiny Trickey, MD

## 2018-01-23 ENCOUNTER — Telehealth: Payer: Self-pay | Admitting: Family Medicine

## 2018-01-23 NOTE — Telephone Encounter (Signed)
Fax ROI to [facility] for records

## 2018-01-27 ENCOUNTER — Emergency Department (HOSPITAL_COMMUNITY)
Admission: EM | Admit: 2018-01-27 | Discharge: 2018-01-27 | Disposition: A | Discharge status: Home / Self Care | Payer: BLUE CROSS/BLUE SHIELD | Attending: Emergency Medicine | Admitting: Emergency Medicine

## 2018-01-27 ENCOUNTER — Other Ambulatory Visit: Payer: Self-pay

## 2018-01-27 ENCOUNTER — Encounter (HOSPITAL_COMMUNITY): Payer: Self-pay | Admitting: Emergency Medicine

## 2018-01-27 DIAGNOSIS — M25551 Pain in right hip: Secondary | ICD-10-CM | POA: Diagnosis present

## 2018-01-27 DIAGNOSIS — Z79899 Other long term (current) drug therapy: Secondary | ICD-10-CM | POA: Insufficient documentation

## 2018-01-27 DIAGNOSIS — R509 Fever, unspecified: Secondary | ICD-10-CM

## 2018-01-27 LAB — BASIC METABOLIC PANEL
Anion gap: 9 (ref 5–15)
BUN: 10 mg/dL (ref 6–20)
CHLORIDE: 105 mmol/L (ref 98–111)
CO2: 23 mmol/L (ref 22–32)
CREATININE: 0.84 mg/dL (ref 0.44–1.00)
Calcium: 9.3 mg/dL (ref 8.9–10.3)
GFR calc Af Amer: >60 mL/min (ref >60)
GFR calc non Af Amer: >60 mL/min (ref >60)
GLUCOSE: 97 mg/dL (ref 70–99)
POTASSIUM: 4.2 mmol/L (ref 3.5–5.1)
Sodium: 137 mmol/L (ref 135–145)

## 2018-01-27 LAB — CBC
HEMATOCRIT: 39.7 % (ref 36.0–46.0)
Hemoglobin: 12.8 g/dL (ref 12.0–15.0)
MCH: 27.9 pg (ref 26.0–34.0)
MCHC: 32.2 g/dL (ref 30.0–36.0)
MCV: 86.7 fL (ref 78.0–100.0)
Platelets: 264 10*3/uL (ref 150–400)
RBC: 4.58 MIL/uL (ref 3.87–5.11)
RDW: 14.2 % (ref 11.5–15.5)
WBC: 20.8 10*3/uL — ABNORMAL HIGH (ref 4.0–10.5)

## 2018-01-27 LAB — HEPATIC FUNCTION PANEL
ALBUMIN: 3.1 g/dL — AB (ref 3.5–5.0)
ALK PHOS: 65 U/L (ref 38–126)
ALT: 22 U/L (ref 0–44)
AST: 28 U/L (ref 15–41)
BILIRUBIN TOTAL: 0.7 mg/dL (ref 0.3–1.2)
Bilirubin, Direct: 0.1 mg/dL (ref 0.0–0.2)
Indirect Bilirubin: 0.6 mg/dL (ref 0.3–0.9)
TOTAL PROTEIN: 6.7 g/dL (ref 6.5–8.1)

## 2018-01-27 LAB — URINALYSIS, ROUTINE W REFLEX MICROSCOPIC
Bilirubin Urine: NEGATIVE
Glucose, UA: NEGATIVE mg/dL
Ketones, ur: NEGATIVE mg/dL
Leukocytes, UA: NEGATIVE
Nitrite: NEGATIVE
PH: 7 (ref 5.0–8.0)
Protein, ur: NEGATIVE mg/dL
SPECIFIC GRAVITY, URINE: 1.004 — AB (ref 1.005–1.030)

## 2018-01-27 LAB — I-STAT CG4 LACTIC ACID, ED: LACTIC ACID, VENOUS: 1.78 mmol/L (ref 0.5–1.9)

## 2018-01-27 LAB — I-STAT BETA HCG BLOOD, ED (MC, WL, AP ONLY): I-stat hCG, quantitative: <5 m[IU]/mL (ref <5)

## 2018-01-27 MED ORDER — SODIUM CHLORIDE 0.9 % IV BOLUS
1000.0000 mL | Freq: Once | INTRAVENOUS | Status: AC
  Administered 2018-01-27: 1000 mL via INTRAVENOUS

## 2018-01-27 MED ORDER — ACETAMINOPHEN 500 MG PO TABS
1000.0000 mg | ORAL_TABLET | Freq: Once | ORAL | Status: AC
  Administered 2018-01-27: 1000 mg via ORAL
  Filled 2018-01-27: qty 2

## 2018-01-27 NOTE — ED Triage Notes (Signed)
Pt reports a recent diagnoses of lupus and has been feeling weak, pt states this morning she felt anabel to get out of bed due to right hip and leg pain and feeling weak. Pt denies any type of injury.

## 2018-01-27 NOTE — ED Notes (Signed)
ED Provider at bedside. 

## 2018-01-29 NOTE — ED Provider Notes (Signed)
[facility]   CSN: [id number] Arrival date & time: 01/27/18  [id number]     History   Chief Complaint Chief Complaint  Patient presents with  . Weakness    HPI Clayborn HeronWysheka D Sooy is a 27 y.o. female.  HPI Patient is a 27 year old female who presents to the emergency department with right hip and leg pain and feeling weak.  She has a history of lupus and states this is sometimes that she feels when she has a lupus flare.  She also has a fever of 101.7 on arrival to the emergency department.  She has no dysuria or urinary frequency.  No productive cough.  She reports mild headache.  No diarrhea.  No nausea or vomiting.  She feels achy in her muscles.  Symptoms are mild to moderate in severity.   Past Medical History:  Diagnosis Date  . Allergic reaction   . Anemia   . Genital herpes   . Hives   . Hx of trichomonal vaginitis   . Lupus (HCC) 2019    Patient Active Problem List   Diagnosis Date Noted  . Term pregnancy 01/26/2015  . Post term pregnancy, 41 weeks 01/26/2015  . Labor and delivery indication for care or intervention 05/15/2013    Past Surgical History:  Procedure Laterality Date  . NO PAST SURGERIES       OB History    Gravida  3   Para  2   Term  2   Preterm      AB      Living  2     SAB      TAB      Ectopic      Multiple  0   Live Births  2            Home Medications    Prior to Admission medications   Medication Sig Start Date End Date Taking? Authorizing Provider  montelukast (SINGULAIR) 10 MG tablet Take 1 tablet (10 mg total) by mouth at bedtime. 01/21/18  Yes Deeann SaintBanks, Shannon R, MD  cetirizine (ZYRTEC) 10 MG tablet Take 1 tablet (10 mg total) by mouth daily. Patient not taking: Reported on 01/27/2018 01/21/18   Deeann SaintBanks, Shannon R, MD  predniSONE (STERAPRED UNI-PAK 21 TAB) 10 MG (21) TBPK tablet Take by mouth daily. Take as directed. 01/14/18   Mardella LaymanHagler, Brian, MD  ranitidine  (ZANTAC) 150 MG tablet Take 1 tablet (150 mg total) by mouth at bedtime. Patient not taking: Reported on 01/27/2018 01/21/18   Deeann SaintBanks, Shannon R, MD    Family History Family History  Problem Relation Age of Onset  . Hypertension Father   . Cancer Mother        uterine  . Diabetes Maternal Uncle   . Hypertension Maternal Grandfather   . Stroke Maternal Grandfather   . Hypertension Paternal Grandfather   . Heart disease Paternal Grandfather   . Stroke Paternal Grandfather   . Hearing loss Neg Hx     Social History Social History   Tobacco Use  . Smoking status: Never Smoker  . Smokeless tobacco: Never Used  Substance Use Topics  . Alcohol use: Yes    Comment: occas  . Drug use: No     Allergies   Hydrocodone   Review of Systems Review of Systems  All other systems reviewed and are negative.    Physical Exam Updated Vital Signs BP (!) 113/53   Pulse (!) 102  Temp 98.8 F (37.1 C) (Oral)   Resp 16   LMP 01/11/2018 (Exact Date)   SpO2 98%   Physical Exam  Constitutional: She is oriented to person, place, and time. She appears well-developed and well-nourished. No distress.  HENT:  Head: Normocephalic and atraumatic.  Eyes: EOM are normal.  Neck: Normal range of motion.  Cardiovascular: Normal rate, regular rhythm and normal heart sounds.  Pulmonary/Chest: Effort normal and breath sounds normal.  Abdominal: Soft. She exhibits no distension. There is no tenderness.  Musculoskeletal:  Able to range her bilateral hips and knees.  No unilateral leg swelling or erythema or warmth  Neurological: She is alert and oriented to person, place, and time.  Skin: Skin is warm and dry.  Psychiatric: She has a normal mood and affect. Judgment normal.  Nursing note and vitals reviewed.    ED Treatments / Results  Labs (all labs ordered are listed, but only abnormal results are displayed) Labs Reviewed  CBC - Abnormal; Notable for the following components:      Result  Value   WBC 20.8 (*)    All other components within normal limits  URINALYSIS, ROUTINE W REFLEX MICROSCOPIC - Abnormal; Notable for the following components:   Color, Urine STRAW (*)    Specific Gravity, Urine 1.004 (*)    Hgb urine dipstick LARGE (*)    Bacteria, UA RARE (*)    All other components within normal limits  HEPATIC FUNCTION PANEL - Abnormal; Notable for the following components:   Albumin 3.1 (*)    All other components within normal limits  BASIC METABOLIC PANEL  I-STAT BETA HCG BLOOD, ED ([facility], [facility], [facility] ONLY)  I-STAT CG4 LACTIC ACID, ED    EKG None  Radiology No results found.  Procedures Procedures (including critical care time)  Medications Ordered in ED Medications  acetaminophen (TYLENOL) tablet 1,000 mg (1,000 mg Oral Given 01/27/18 1226)  sodium chloride 0.9 % bolus 1,000 mL (0 mLs Intravenous Stopped 01/27/18 1319)     Initial Impression / Assessment and Plan / ED Course  I have reviewed the triage vital signs and the nursing notes.  Pertinent labs & imaging results that were available during my care of the patient were reviewed by me and considered in my medical decision making (see chart for details).     No signs of cellulitis.  Able to range the right hip.  I do not think this is the presentation of a septic arthritis.  Abdomen is benign.  Likely febrile illness with associated myalgias.  Patient encouraged to return the emergency department for new or worsening symptoms.  Ambulatory at time of discharge.  Final Clinical Impressions(s) / ED Diagnoses   Final diagnoses:  Febrile illness    ED Discharge Orders    None       Azalia Bilis, MD 01/29/18 2124

## 2018-01-30 ENCOUNTER — Encounter: Payer: Self-pay | Admitting: Internal Medicine

## 2018-01-30 ENCOUNTER — Ambulatory Visit: Payer: BLUE CROSS/BLUE SHIELD | Admitting: Internal Medicine

## 2018-01-30 VITALS — BP 122/64 | HR 101 | Ht 63.0 in | Wt 204.4 lb

## 2018-01-30 DIAGNOSIS — R760 Raised antibody titer: Secondary | ICD-10-CM | POA: Diagnosis not present

## 2018-01-30 DIAGNOSIS — R079 Chest pain, unspecified: Secondary | ICD-10-CM | POA: Diagnosis not present

## 2018-01-30 NOTE — Patient Instructions (Addendum)
Your physician has requested that you have an echocardiogram. Echocardiography is a painless test that uses sound waves to create images of your heart. It provides your doctor with information about the size and shape of your heart and how well your heart's chambers and valves are working. This procedure takes approximately one hour. There are no restrictions for this procedure. -- this is done at [address] - [address]rd Floor -- to check on cost/coverage: call your insurance early next week and notify them you are having an echocardiogram in an outpatient hospital facility and the billing code is [phone]  Your physician recommends that you schedule a follow-up appointment as needed with Dr. Rennis GoldenHilty.   Dr. Zenovia JordanAngela Hawkes @ [facility]

## 2018-01-30 NOTE — Progress Notes (Signed)
OFFICE CONSULT NOTE  Chief Complaint:  Chest pain  Primary Care Physician: Victoria BankerKoberlein, Junell C, MD  HPI:  Victoria Olson is a 27 y.o. female who is being seen today for the evaluation of chest pain at the request of Victoria HartPinn, Walda, MD.  This is a pleasant 27 year old female who works as a Camera operatormental health specialist.  She is a Engineer, maintenance (IT)college graduate and recently has developed chest pain.  She reports a long-standing history of developing hives.  She is taken Benadryl for this.  Recently she is noted increasing skin rashes and associated chest pain.  The chest pain does sound somewhat positional, worse laying down and better sitting up.  Work-up included screening ANA which was positive, indicating a homogeneous pattern with a titer of 1:320.  Findings may suggest autoimmune disorder such as lupus.  I am concerned that her symptom pattern is suggestive of possible pericarditis which would fit a diagnosis of lupus.  Other possible causes of chest pain could include coronary artery disease or some cardiomyopathy, however given her young age I think that obstructive coronary artery disease is unlikely.  She has few risk factors other than obesity.  Fortunately, her chest pain has resolved.  PMHx:  Past Medical History:  Diagnosis Date  . Allergic reaction   . Anemia   . Genital herpes   . Hives   . Hx of trichomonal vaginitis   . Lupus (HCC) 2019    Past Surgical History:  Procedure Laterality Date  . NO PAST SURGERIES      FAMHx:  Family History  Problem Relation Age of Onset  . Hypertension Father   . Cancer Mother        uterine  . Diabetes Maternal Uncle   . Hypertension Maternal Grandfather   . Stroke Maternal Grandfather   . Hypertension Paternal Grandfather   . Heart disease Paternal Grandfather   . Stroke Paternal Grandfather   . Hearing loss Neg Hx    SOCHx:   reports that she has never smoked. She has never used smokeless tobacco. She reports that she drinks alcohol. She  reports that she does not use drugs.  ALLERGIES:  Allergies  Allergen Reactions  . Hydrocodone Hives    ROS: Pertinent items noted in HPI and remainder of comprehensive ROS otherwise negative.  HOME MEDS: Current Outpatient Medications on File Prior to Visit  Medication Sig Dispense Refill  . cetirizine (ZYRTEC) 10 MG tablet Take 10 mg by mouth daily.    . montelukast (SINGULAIR) 10 MG tablet Take 1 tablet (10 mg total) by mouth at bedtime. 30 tablet 4   No current facility-administered medications on file prior to visit.     LABS/IMAGING: No results found for this or any previous visit (from the past 48 hour(s)). No results found.  LIPID PANEL: No results found for: CHOL, TRIG, HDL, CHOLHDL, VLDL, LDLCALC, LDLDIRECT  WEIGHTS: Wt Readings from Last 3 Encounters:  01/30/18 204 lb 6.4 oz (92.7 kg)  01/21/18 204 lb (92.5 kg)  01/14/18 200 lb (90.7 kg)    VITALS: BP 122/64   Pulse (!) 101   Ht 5\' 3"  (1.6 m)   Wt 204 lb 6.4 oz (92.7 kg)   LMP 01/11/2018 (Exact Date)   SpO2 99%   BMI 36.21 kg/m   EXAM: General appearance: alert and no distress Neck: no JVD, supple, symmetrical, trachea midline and thyroid not enlarged, symmetric, no tenderness/mass/nodules Lungs: clear to auscultation bilaterally Heart: regular rate and rhythm, S1, S2 normal,  no murmur, click, rub or gallop Abdomen: soft, non-tender; bowel sounds normal; no masses,  no organomegaly Extremities: extremities normal, atraumatic, no cyanosis or edema Pulses: 2+ and symmetric Skin: Skin color, texture, turgor normal. No rashes or lesions Neurologic: Grossly normal Psych: Pleasant  EKG: Deferred  ASSESSMENT: 1. 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 Positive ANA, homogeneous, titer 1: 320, suggestive of possible lupus  PLAN: 1.   [name] has had unexplained chest pain which is positional and mostly resolved.  She was given steroids in the emergency department which likely helped her situation.   She is noted to be mildly tachycardic today.  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  I recommend an echocardiogram to evaluate for pericardial effusion and assess LV function.  She will need rheumatologic evaluation.  Apparently she was referred to Victoria Olson, however the office would not see her until she had cardiac evaluation.  She is now requesting to see another rheumatologist and I recommended Dr. Zenovia Olson with [facility].  We will plan follow-up after her echo.  Essentially if this is a lupus pericarditis, treatment of the underlying disorder will likely resolve the pericardial pain and inflammation.  Thanks again for the kind referral.  Chrystie Nose, MD, [facility]    [facility]  Medical Director of the [facility]  [facility] Diplomate of the American Board of Clinical Lipidology Attending Cardiologist  Direct Dial: [phone]  Fax: [phone]  Website:  [name] 01/30/2018, 4:31 PM

## 2018-02-03 ENCOUNTER — Encounter: Payer: Self-pay | Admitting: Family Medicine

## 2018-02-04 ENCOUNTER — Telehealth: Payer: Self-pay | Admitting: Internal Medicine

## 2018-02-04 NOTE — Telephone Encounter (Signed)
New Message       Patient had to cancel appointment due to insurance not covering the particular test. Patient is there a similar or cheaper test that can be done? Pls advise

## 2018-02-04 NOTE — Telephone Encounter (Deleted)
Fwd to Dr.Hilty for further recommendation 

## 2018-02-04 NOTE — Telephone Encounter (Signed)
Fwd to Dr.Hilty for further recommendation

## 2018-02-06 ENCOUNTER — Other Ambulatory Visit (HOSPITAL_COMMUNITY): Payer: Self-pay

## 2018-02-08 NOTE — Telephone Encounter (Signed)
There is no other alternative test that is cheaper to look for pericardial effusion.  Dr. Rexene Edison

## 2018-02-09 NOTE — Telephone Encounter (Signed)
Patient called and made aware of MD advice.   She is asking if our office can provide her with info on the "contracted amount" for the test - will route to billing  Also explained that [facility] offers a payment plan as well

## 2018-02-09 NOTE — Telephone Encounter (Signed)
Done. thanks

## 2018-02-09 NOTE — Telephone Encounter (Signed)
[facility] billing contacted patient and test can be done at [facility] for approx $740 which patient can afford. Order changed from 8/30 office visit encounter to reflect change in testing location.

## 2018-02-09 NOTE — Addendum Note (Signed)
Addended by: Lindell Spar on: 02/09/2018 02:16 PM   Modules accepted: Orders

## 2018-02-19 ENCOUNTER — Telehealth: Payer: Self-pay | Admitting: Internal Medicine

## 2018-02-19 NOTE — Telephone Encounter (Signed)
Patient informed that echocardiogram was to assess for pericardial effusion as well as left ventricle function per Dr. Blanchie DessertHilty's note. Patient verbally understands and will have echocardiogram as scheduled tomorrow.

## 2018-02-19 NOTE — Telephone Encounter (Signed)
New Message          Patient is calling today to see if having the heart scan is still necessary, patient was diagnosed with lupous by one doctor, then she was told that she had high inflammation in the blood by another. So now patient is wondering if the heart scan  Is necessary? Pls call and advise.

## 2018-02-20 ENCOUNTER — Ambulatory Visit (INDEPENDENT_AMBULATORY_CARE_PROVIDER_SITE_OTHER): Payer: BLUE CROSS/BLUE SHIELD

## 2018-02-20 ENCOUNTER — Other Ambulatory Visit: Payer: Self-pay

## 2018-02-20 DIAGNOSIS — R079 Chest pain, unspecified: Secondary | ICD-10-CM
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
## 2019-06-08 ENCOUNTER — Ambulatory Visit: Payer: Medicaid Other | Attending: Internal Medicine

## 2019-12-20 ENCOUNTER — Ambulatory Visit: Payer: Medicaid Other | Attending: Internal Medicine

## 2019-12-20 DIAGNOSIS — Z20822 Contact with and (suspected) exposure to covid-19: Secondary | ICD-10-CM

## 2019-12-21 LAB — NOVEL CORONAVIRUS, NAA: SARS-CoV-2, NAA: NOT DETECTED

## 2019-12-21 LAB — SARS-COV-2, NAA 2 DAY TAT

## 2020-05-23 DIAGNOSIS — N898 Other specified noninflammatory disorders of vagina: Secondary | ICD-10-CM | POA: Diagnosis not present

## 2020-05-23 DIAGNOSIS — Z124 Encounter for screening for malignant neoplasm of cervix: Secondary | ICD-10-CM | POA: Diagnosis not present

## 2020-05-23 DIAGNOSIS — Z113 Encounter for screening for infections with a predominantly sexual mode of transmission: Secondary | ICD-10-CM | POA: Diagnosis not present

## 2020-05-23 DIAGNOSIS — N926 Irregular menstruation, unspecified: Secondary | ICD-10-CM | POA: Diagnosis not present

## 2020-08-01 DIAGNOSIS — R059 Cough, unspecified: Secondary | ICD-10-CM | POA: Diagnosis not present

## 2020-08-01 DIAGNOSIS — Z20822 Contact with and (suspected) exposure to covid-19: Secondary | ICD-10-CM | POA: Diagnosis not present

## 2020-08-01 DIAGNOSIS — Z1152 Encounter for screening for COVID-19: Secondary | ICD-10-CM | POA: Diagnosis not present

## 2020-08-01 DIAGNOSIS — R0981 Nasal congestion: Secondary | ICD-10-CM | POA: Diagnosis not present

## 2020-08-15 LAB — HM PAP SMEAR: HM Pap smear: NORMAL

## 2020-12-18 DIAGNOSIS — E669 Obesity, unspecified: Secondary | ICD-10-CM | POA: Diagnosis not present

## 2020-12-18 DIAGNOSIS — Z0000 Encounter for general adult medical examination without abnormal findings: Secondary | ICD-10-CM | POA: Diagnosis not present

## 2020-12-18 DIAGNOSIS — Z113 Encounter for screening for infections with a predominantly sexual mode of transmission: Secondary | ICD-10-CM | POA: Diagnosis not present

## 2020-12-18 DIAGNOSIS — R768 Other specified abnormal immunological findings in serum: Secondary | ICD-10-CM | POA: Diagnosis not present

## 2021-05-15 DIAGNOSIS — Z79899 Other long term (current) drug therapy: Secondary | ICD-10-CM | POA: Diagnosis not present

## 2021-05-15 DIAGNOSIS — M2550 Pain in unspecified joint: Secondary | ICD-10-CM | POA: Diagnosis not present

## 2021-05-15 DIAGNOSIS — R5383 Other fatigue: Secondary | ICD-10-CM | POA: Diagnosis not present

## 2021-05-15 DIAGNOSIS — R768 Other specified abnormal immunological findings in serum: Secondary | ICD-10-CM | POA: Diagnosis not present

## 2021-05-15 DIAGNOSIS — Z8709 Personal history of other diseases of the respiratory system: Secondary | ICD-10-CM | POA: Diagnosis not present

## 2021-05-15 DIAGNOSIS — L509 Urticaria, unspecified: Secondary | ICD-10-CM | POA: Diagnosis not present

## 2021-05-15 DIAGNOSIS — M359 Systemic involvement of connective tissue, unspecified: Secondary | ICD-10-CM | POA: Diagnosis not present

## 2021-08-23 DIAGNOSIS — M25561 Pain in right knee: Secondary | ICD-10-CM | POA: Diagnosis not present

## 2021-08-28 DIAGNOSIS — Z3009 Encounter for other general counseling and advice on contraception: Secondary | ICD-10-CM | POA: Diagnosis not present

## 2021-08-28 DIAGNOSIS — N898 Other specified noninflammatory disorders of vagina: Secondary | ICD-10-CM | POA: Diagnosis not present

## 2021-08-28 DIAGNOSIS — Z309 Encounter for contraceptive management, unspecified: Secondary | ICD-10-CM | POA: Diagnosis not present

## 2021-08-28 DIAGNOSIS — R768 Other specified abnormal immunological findings in serum: Secondary | ICD-10-CM | POA: Diagnosis not present

## 2021-08-28 DIAGNOSIS — Z113 Encounter for screening for infections with a predominantly sexual mode of transmission: Secondary | ICD-10-CM | POA: Diagnosis not present

## 2021-09-04 DIAGNOSIS — S83221A Peripheral tear of medial meniscus, current injury, right knee, initial encounter: Secondary | ICD-10-CM | POA: Diagnosis not present

## 2021-09-18 DIAGNOSIS — F4323 Adjustment disorder with mixed anxiety and depressed mood: Secondary | ICD-10-CM | POA: Diagnosis not present

## 2021-09-24 DIAGNOSIS — N898 Other specified noninflammatory disorders of vagina: Secondary | ICD-10-CM | POA: Diagnosis not present

## 2021-09-25 DIAGNOSIS — F4323 Adjustment disorder with mixed anxiety and depressed mood: Secondary | ICD-10-CM | POA: Diagnosis not present

## 2021-09-27 DIAGNOSIS — L509 Urticaria, unspecified: Secondary | ICD-10-CM | POA: Diagnosis not present

## 2021-09-27 DIAGNOSIS — Z79899 Other long term (current) drug therapy: Secondary | ICD-10-CM | POA: Diagnosis not present

## 2021-09-27 DIAGNOSIS — M1990 Unspecified osteoarthritis, unspecified site: Secondary | ICD-10-CM | POA: Diagnosis not present

## 2021-09-27 DIAGNOSIS — M359 Systemic involvement of connective tissue, unspecified: Secondary | ICD-10-CM | POA: Diagnosis not present

## 2021-09-27 DIAGNOSIS — R519 Headache, unspecified: Secondary | ICD-10-CM | POA: Diagnosis not present

## 2021-09-27 DIAGNOSIS — M2550 Pain in unspecified joint: Secondary | ICD-10-CM | POA: Diagnosis not present

## 2021-09-27 DIAGNOSIS — Z8709 Personal history of other diseases of the respiratory system: Secondary | ICD-10-CM | POA: Diagnosis not present

## 2021-09-27 DIAGNOSIS — R5383 Other fatigue: Secondary | ICD-10-CM | POA: Diagnosis not present

## 2021-11-23 DIAGNOSIS — E669 Obesity, unspecified: Secondary | ICD-10-CM | POA: Diagnosis not present

## 2022-04-01 ENCOUNTER — Encounter: Payer: Self-pay | Admitting: Neurology
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
## 2022-04-10 ENCOUNTER — Encounter: Payer: Self-pay | Admitting: Neurology

## 2022-04-10 ENCOUNTER — Other Ambulatory Visit (INDEPENDENT_AMBULATORY_CARE_PROVIDER_SITE_OTHER): Payer: BC Managed Care – PPO

## 2022-04-10 ENCOUNTER — Ambulatory Visit: Payer: BC Managed Care – PPO | Admitting: Neurology

## 2022-04-10 VITALS — BP 122/84 | HR 82 | Ht 63.0 in

## 2022-04-10 DIAGNOSIS — M79642 Pain in left hand: Secondary | ICD-10-CM

## 2022-04-10 DIAGNOSIS — R202 Paresthesia of skin: Secondary | ICD-10-CM

## 2022-04-10 DIAGNOSIS — M79641 Pain in right hand: Secondary | ICD-10-CM | POA: Diagnosis not present

## 2022-04-10 DIAGNOSIS — M79671 Pain in right foot: Secondary | ICD-10-CM

## 2022-04-10 DIAGNOSIS — R200 Anesthesia of skin: Secondary | ICD-10-CM

## 2022-04-10 DIAGNOSIS — M359 Systemic involvement of connective tissue, unspecified: Secondary | ICD-10-CM

## 2022-04-10 DIAGNOSIS — E559 Vitamin D deficiency, unspecified: Secondary | ICD-10-CM

## 2022-04-10 DIAGNOSIS — M79672 Pain in left foot: Secondary | ICD-10-CM

## 2022-04-10 LAB — FOLATE: Folate: >23.8 ng/mL (ref >5.9)

## 2022-04-10 NOTE — Patient Instructions (Addendum)
ELECTROMYOGRAM AND NERVE CONDUCTION STUDIES (EMG/NCS) INSTRUCTIONS  How to Prepare The neurologist conducting the EMG will need to know if you have certain medical conditions. Tell the neurologist and other EMG lab personnel if you: Have a pacemaker or any other electrical medical device Take blood-thinning medications Have hemophilia, a blood-clotting disorder that causes prolonged bleeding Bathing Take a shower or bath shortly before your exam in order to remove oils from your skin. Don't apply lotions or creams before the exam.  What to Expect You'll likely be asked to change into a hospital gown for the procedure and lie down on an examination table. The following explanations can help you understand what will happen during the exam.  Electrodes. The neurologist or a technician places surface electrodes at various locations on your skin depending on where you're experiencing symptoms. Or the neurologist may insert needle electrodes at different sites depending on your symptoms.  Sensations. The electrodes will at times transmit a tiny electrical current that you may feel as a twinge or spasm. The needle electrode may cause discomfort or pain that usually ends shortly after the needle is removed. If you are concerned about discomfort or pain, you may want to talk to the neurologist about taking a short break during the exam.  Instructions. During the needle EMG, the neurologist will assess whether there is any spontaneous electrical activity when the muscle is at rest - activity that isn't present in healthy muscle tissue - and the degree of activity when you slightly contract the muscle.  He or she will give you instructions on resting and contracting a muscle at appropriate times. Depending on what muscles and nerves the neurologist is examining, he or she may ask you to change positions during the exam.  After your EMG You may experience some temporary, minor bruising where the needle  electrode was inserted into your muscle. This bruising should fade within several days. If it persists, contact your primary care doctor.      You can try lidocaine cream for pain in feet or hands. It will cause the skin to be numb, so wear gloves if you do not want numb hands.  I will get lab work today and be in touch with the results. We will discuss if you want EMG at that point.  The physicians and staff at [facility] are committed to providing excellent care. You may receive a survey requesting feedback about your experience at our office. We strive to receive "very good" responses to the survey questions. If you feel that your experience would prevent you from giving the office a "very good " response, please contact our office to try to remedy the situation. We may be reached at [phone]. Thank you for taking the time out of your busy day to complete the survey.  Jacquelyne Balint, MD [facility]

## 2022-04-15 LAB — IMMUNOFIXATION ELECTROPHORESIS
IgG (Immunoglobin G), Serum: 1495 mg/dL (ref 600–1640)
Immunoglobulin A: 194 mg/dL (ref 47–310)

## 2022-04-18 LAB — IMMUNOFIXATION ELECTROPHORESIS
IgG (Immunoglobin G), Serum: 1495 mg/dL (ref 600–1640)
IgM, Serum: 97 mg/dL (ref 50–300)

## 2022-04-18 LAB — VITAMIN B1: Vitamin B1 (Thiamine): 6 nmol/L — ABNORMAL LOW (ref 8–30)

## 2022-05-17 ENCOUNTER — Encounter (HOSPITAL_BASED_OUTPATIENT_CLINIC_OR_DEPARTMENT_OTHER): Payer: Self-pay | Admitting: Emergency Medicine

## 2022-05-17 ENCOUNTER — Emergency Department (HOSPITAL_BASED_OUTPATIENT_CLINIC_OR_DEPARTMENT_OTHER): Payer: BC Managed Care – PPO

## 2022-05-17 ENCOUNTER — Other Ambulatory Visit: Payer: Self-pay

## 2022-05-17 ENCOUNTER — Emergency Department (HOSPITAL_BASED_OUTPATIENT_CLINIC_OR_DEPARTMENT_OTHER)
Admission: EM | Admit: 2022-05-17 | Discharge: 2022-05-17 | Disposition: A | Discharge status: Home / Self Care | Payer: BC Managed Care – PPO | Attending: Emergency Medicine | Admitting: Emergency Medicine

## 2022-05-17 DIAGNOSIS — O3680X0 Pregnancy with inconclusive fetal viability, not applicable or unspecified: Secondary | ICD-10-CM | POA: Diagnosis not present

## 2022-05-17 DIAGNOSIS — R1030 Lower abdominal pain, unspecified: Secondary | ICD-10-CM | POA: Insufficient documentation

## 2022-05-17 DIAGNOSIS — O209 Hemorrhage in early pregnancy, unspecified: Secondary | ICD-10-CM | POA: Diagnosis not present

## 2022-05-17 DIAGNOSIS — Z3A01 Less than 8 weeks gestation of pregnancy: Secondary | ICD-10-CM | POA: Diagnosis not present

## 2022-05-17 DIAGNOSIS — Z3A00 Weeks of gestation of pregnancy not specified: Secondary | ICD-10-CM | POA: Diagnosis not present

## 2022-05-17 DIAGNOSIS — O26891 Other specified pregnancy related conditions, first trimester: Secondary | ICD-10-CM | POA: Insufficient documentation

## 2022-05-17 DIAGNOSIS — R109 Unspecified abdominal pain: Secondary | ICD-10-CM | POA: Diagnosis present

## 2022-05-17 LAB — CBC WITH DIFFERENTIAL/PLATELET
Abs Immature Granulocytes: 0.01 10*3/uL (ref 0.00–0.07)
Basophils Absolute: 0 10*3/uL (ref 0.0–0.1)
Basophils Relative: 0 %
Eosinophils Absolute: 0.2 10*3/uL (ref 0.0–0.5)
Eosinophils Relative: 3 %
HCT: 37.6 % (ref 36.0–46.0)
Hemoglobin: 12.3 g/dL (ref 12.0–15.0)
Immature Granulocytes: 0 %
Lymphocytes Relative: 9 %
Lymphs Abs: 0.7 10*3/uL (ref 0.7–4.0)
MCH: 28.2 pg (ref 26.0–34.0)
MCHC: 32.7 g/dL (ref 30.0–36.0)
MCV: 86.2 fL (ref 80.0–100.0)
Monocytes Absolute: 0.6 10*3/uL (ref 0.1–1.0)
Monocytes Relative: 8 %
Neutro Abs: 5.9 10*3/uL (ref 1.7–7.7)
Neutrophils Relative %: 80 %
Platelets: 240 10*3/uL (ref 150–400)
RBC: 4.36 MIL/uL (ref 3.87–5.11)
RDW: 14.8 % (ref 11.5–15.5)
WBC: 7.4 10*3/uL (ref 4.0–10.5)
nRBC: 0 % (ref 0.0–0.2)

## 2022-05-17 LAB — URINALYSIS, ROUTINE W REFLEX MICROSCOPIC
Bilirubin Urine: NEGATIVE
Glucose, UA: NEGATIVE mg/dL
Ketones, ur: NEGATIVE mg/dL
Leukocytes,Ua: NEGATIVE
Nitrite: NEGATIVE
Protein, ur: 30 mg/dL — AB
Specific Gravity, Urine: 1.018 (ref 1.005–1.030)
pH: 6.5 (ref 5.0–8.0)

## 2022-05-17 LAB — WET PREP, GENITAL
Clue Cells Wet Prep HPF POC: NONE SEEN
Sperm: NONE SEEN
Trich, Wet Prep: NONE SEEN
WBC, Wet Prep HPF POC: <10 (ref <10)
Yeast Wet Prep HPF POC: NONE SEEN

## 2022-05-17 LAB — BASIC METABOLIC PANEL
Anion gap: 9 (ref 5–15)
BUN: <5 mg/dL — ABNORMAL LOW (ref 6–20)
CO2: 24 mmol/L (ref 22–32)
Calcium: 8.7 mg/dL — ABNORMAL LOW (ref 8.9–10.3)
Chloride: 105 mmol/L (ref 98–111)
Creatinine, Ser: 0.6 mg/dL (ref 0.44–1.00)
GFR, Estimated: >60 mL/min (ref >60)
Glucose, Bld: 90 mg/dL (ref 70–99)
Potassium: 3.5 mmol/L (ref 3.5–5.1)
Sodium: 138 mmol/L (ref 135–145)

## 2022-05-17 LAB — HCG, QUANTITATIVE, PREGNANCY: hCG, Beta Chain, Quant, S: 286 m[IU]/mL — ABNORMAL HIGH (ref <5)

## 2022-05-17 NOTE — ED Provider Notes (Signed)
[facility] EMERGENCY DEPT Provider Note   CSN: [id number] Arrival date & time: 05/17/22  0935     History  Chief Complaint  Patient presents with   Abdominal Pain   Vaginal Bleeding    Victoria Olson is a 31 y.o. female.  With a history of genital herpes, trichomoniasis, lupus who presents the ED for evaluation of lower abdominal pain, positive pregnancy test, vaginal bleeding.  She states she presented to her primary care provider 10 days ago due to 1 positive pregnancy test at home.  States she took 4 home pregnancy tests on sequential days with the last 1 being positive.  She had a negative urine pregnancy test at her primary care provider, but did have a positive quantitative hCG of 50 which would have put her at 3 to [redacted] weeks pregnant.  She did plan to terminate the pregnancy.  She presented to the pregnancy termination clinic today where they performed an ultrasound and was unable to see a fetus.  Was sent here to rule out ectopic pregnancy versus spontaneous abortion.  Has had lower abdominal cramping which feels similar to when she gets her menstrual cycle for the past 5 days.  Has also had intermittent sharp and stabbing right-sided pelvic pains for the same timeframe.  States she has had spotting for the past 3 days that has progressively increased in flow.  Only notices this when she wipes.  Denies other abdominal pain, urinary symptoms, vaginal symptoms, fevers.  Has had miscarriages in the past and states that they were much more painful than the symptoms that she has now.  Last menstrual period 04/02/2022.   Abdominal Pain Associated symptoms: vaginal bleeding   Vaginal Bleeding      Home Medications Prior to Admission medications   Medication Sig Start Date End Date Taking? Authorizing Provider  cetirizine (ZYRTEC) 10 MG tablet Take 10 mg by mouth daily.    [provider]  hydroxychloroquine (PLAQUENIL) 200 MG tablet Take 200 mg by mouth 2 (two)  times daily.    [provider]  montelukast (SINGULAIR) 10 MG tablet Take 1 tablet (10 mg total) by mouth at bedtime. 01/21/18   Deeann Saint, MD  Vitamin D, Ergocalciferol, (DRISDOL) 1.25 MG (50000 UNIT) CAPS capsule Take 50,000 Units by mouth every 7 (seven) days.    [provider]      Allergies    Hydrocodone    Review of Systems   Review of Systems  Genitourinary:  Positive for pelvic pain and vaginal bleeding.  All other systems reviewed and are negative.   Physical Exam Updated Vital Signs BP (!) 140/75 (BP Location: Right Arm)   Pulse 99   Temp 99.2 F (37.3 C) (Oral)   Resp 18   SpO2 100%  Physical Exam Vitals and nursing note reviewed. Exam conducted with a chaperone present Herbert Seta, RN).  Constitutional:      General: She is not in acute distress.    Appearance: She is well-developed. She is obese. She is not ill-appearing, toxic-appearing or diaphoretic.  HENT:     Head: Normocephalic and atraumatic.  Eyes:     Conjunctiva/sclera: Conjunctivae normal.  Cardiovascular:     Rate and Rhythm: Normal rate and regular rhythm.     Heart sounds: No murmur heard. Pulmonary:     Effort: Pulmonary effort is normal. No respiratory distress.     Breath sounds: Normal breath sounds.  Abdominal:     Palpations: Abdomen is soft.  Tenderness: There is no abdominal tenderness. There is no guarding.  Genitourinary:    Exam position: Lithotomy position.     Vagina: Bleeding present.     Cervix: No discharge.     Uterus: Not tender.      Adnexa:        Right: No mass or tenderness.         Left: No mass or tenderness.       Comments: Small amount of dark red blood, closed cervical os, no vaginal, cervical, uterine or adnexal tenderness Musculoskeletal:        General: No swelling.     Cervical back: Neck supple.  Skin:    General: Skin is warm and dry.     Capillary Refill: Capillary refill takes less than 2 seconds.  Neurological:     Mental  Status: She is alert.  Psychiatric:        Mood and Affect: Mood normal.     ED Results / Procedures / Treatments   Labs (all labs ordered are listed, but only abnormal results are displayed) Labs Reviewed  BASIC METABOLIC PANEL  CBC WITH DIFFERENTIAL/PLATELET  HCG, QUANTITATIVE, PREGNANCY  URINALYSIS, ROUTINE W REFLEX MICROSCOPIC    EKG None  Radiology No results found.  Procedures Procedures    Medications Ordered in ED Medications - No data to display  ED Course/ Medical Decision Making/ A&P Clinical Course as of 05/17/22 1219  Fri May 17, 2022  1151 hCG, quantitative, pregnancy(!) Was 50 on 05/06/2022 [AS]  1209 Spoke with Dr. Para March with OB. She recommends follow-up hCG on Monday with appointment afterwards.  Stressed importance of return precautions for ectopic pregnancy.  Encouraged her to [facility] if she has symptoms. [AS]    Clinical Course User Index [AS] Jace Fermin, Edsel Petrin, PA-C                           Medical Decision Making Amount and/or Complexity of Data Reviewed Labs: ordered. Radiology: ordered.  This patient presents to the ED for concern of abdominal pain and vaginal bleeding with positive pregnancy test, this involves an extensive number of treatment options, and is a complaint that carries with it a high risk of complications and morbidity. The differential diagnosis for vaginal bleeding in pregnancy less that 20 weeks includes but is not limited too the following: ectopic pregnancy, subchorionic hematoma, first trimester abortion (Complete Abortion, Incomplete Abortion, Inevitable Abortion, Missed Abortion, Septic abortion, Threatened Abortion),Gestational trophoblastic disease, Heterotopic pregnancy, Implantation bleeding, Molar pregnancy, Non-pregnancy related bleeding   Co morbidities that complicate the patient evaluation  genital herpes, trichomoniasis, lupus  My initial workup includes BMP, CBC, hCG  quantitative, urinalysis, wet prep  Additional history obtained from: Nursing notes from this visit. Previous records within EMR system office visit on 05/06/2022 presents emergency test.  Had an HCG 50 at that time.  I ordered, reviewed and interpreted labs which include: urinalysis, wet prep, CBC, BMP, quant hCG. hCG of 286 up from 50 4 days ago. Labs otherwise unremarkable  I ordered imaging studies including OB US I independently visualized and interpreted imaging which showed no  I agree with the radiologist interpretation  I requested consultation with obstetrics Dr. Para March.  She recommends follow-up for repeat hCG on Monday and evaluation at that time.  Does not need RhoGAM prophylaxis.  [facility] if her symptoms get worse.  Afebrile,  hematin stable.  31 year old female presenting to the ED via clinic for elective abortion for lower abdominal pain and vaginal bleeding.  She had a positive quantitative hCG on 05/06/2022 and this was 50.  She was referred to elective abortion clinic by her primary care provider as she does not want to keep the baby.  She has had lower abdominal cramping with some right lower quadrant sharp and shooting pains for the past 5 days.  Started bleeding during that time as well.  Bleeding has gotten slightly worse, but is still light.  Her quantitative hCG here was 286.  Ultrasound was negative for IUP, adnexal or ovarian masses.  Dr. Para March with OB was consulted and recommends patient follow-up on Monday at 830 at their [facility] for repeat hCG and evaluation at that time.  Concern is currently an ectopic pregnancy with increasing hCGs and sharp and shooting right lower quadrant pains.  Patient was strongly encouraged to keep her appointment on Monday.  A significant amount of time was spent educating patient on the possible outcomes of an untreated ectopic pregnancy and the side effects to watch for.  She was  informed that this may potentially evolve into a life-threatening issue if she does not follow-up.  She was given strict return precautions and encouraged to [facility] with [facility] over the weekend if she develops severe symptoms.  She was unhappy as she states she had to pay the abortion clinic $300 for them to "do nothing but sent me here."  She then was frustration at needing to pay for her ED visit as well has her follow-up appointment.  She was educated again on the importance of following up.  Stable at discharge.  At this time there does not appear to be any evidence of an acute emergency medical condition and the patient appears stable for discharge with appropriate outpatient follow up. Diagnosis was discussed with patient who verbalizes understanding of care plan and is agreeable to discharge. I have discussed return precautions with patient who verbalizes understanding. Patient encouraged to follow-up with their PCP within 1 week. All questions answered.  Patient's case discussed with Dr. Criss Alvine who agrees with plan to discharge with follow-up.   Note: Portions of this report may have been transcribed using voice recognition software. Every effort was made to ensure accuracy; however, inadvertent computerized transcription errors may still be present.          Final Clinical Impression(s) / ED Diagnoses Final diagnoses:  None    Rx / DC Orders ED Discharge Orders     None         Michelle Piper, Cordelia Poche 05/17/22 1300    Pricilla Loveless, MD 05/22/22 [phone]

## 2022-05-17 NOTE — ED Notes (Signed)
RN provided AVS using Teachback Method. Patient verbalizes understanding of Discharge Instructions. Opportunity for Questioning and Answers were provided by RN. Patient Discharged from ED ambulatory to Home via Self.  

## 2022-05-17 NOTE — Discharge Instructions (Signed)
You have been seen today for your complaint of abdominal pain, vaginal bleeding. Your lab work positive for pregnancy. Your imaging did not show any intrauterine pregnancies. [facility]  This is at [address] your appointment is Monday at 8:30 AM. Please seek immediate medical care if you develop any of the following symptoms: Your pain gets worse or is not helped by medicine. You feel dizzy or weak. You feel light-headed. You faint. You have sudden and very bad pain in your belly. You have very bad pain in your shoulder or neck. At this time there does not appear to be the presence of an emergent medical condition, however there is always the potential for conditions to change. Please read and follow the below instructions.  Do not take your medicine if  develop an itchy rash, swelling in your mouth or lips, or difficulty breathing; call 911 and seek immediate emergency medical attention if this occurs.  You may review your lab tests and imaging results in their entirety on your MyChart account.  Please discuss all results of fully with your primary care provider and other specialist at your follow-up visit.  Note: Portions of this text may have been transcribed using voice recognition software. Every effort was made to ensure accuracy; however, inadvertent computerized transcription errors may still be present.

## 2022-05-17 NOTE — Consult Note (Signed)
   OB/GYN Telephone Consult  Victoria Olson is a 31 y.o. [id number] presenting with abdominal pain and vaginal bleeding in the setting urine pregnancy.    I was called for a consult regarding the care of this patient by [facility].    The provider had a clinical question regarding management and follow up.    The provider presented the following relevant clinical information and I performed a chart review on the patient and reviewed available documentation:  The patient is a 31 yo at unknown gestational age who presented with vaginal bleeding and pain. Her bleeding is not heavy. She had an HCG with her PCP which was 50 on 12/4 as reviewed in careeverywhere. She then had an HCG today which is 286. She had a TVUS which shows no IUP/EUP.       Latest Ref Rng & Units 05/17/2022   10:44 AM 01/27/2018    9:52 AM 01/27/2015    5:58 AM  CBC  WBC 4.0 - 10.5 K/uL 7.4  20.8  14.4   Hemoglobin 12.0 - 15.0 g/dL 87.5  64.3  32.9   Hematocrit 36.0 - 46.0 % 37.6  39.7  33.4   Platelets 150 - 400 K/uL 240  264  222      I reviewed her pelvic ultrasound images independently and my findings are: No IUP/EUP, no free fluid, no adnexal masses  I reviewed CareEverywhere: HCG was 50.   BP 131/72 (BP Location: Right Arm)   Pulse 97   Temp 99.2 F (37.3 C) (Oral)   Resp 16   SpO2 100%   Exam- performed by consulting provider   Recommendations:  - Blood type is Rh positive - Follow up HCG scheduled for Monday at [facility] office on [address] at 830 am. Patient should come for that appointment and will get results same day - Return to [facility] for any worsening symptoms - please provide patient ectopic precautions.   Thank you for this consult and if additional recommendations are needed please call [phone] for the OB/GYN attending on service at [facility].   I spent approximately 5 minutes directly consulting with the provider and verbally discussing this case. Additionally 10 minutes minutes was spent  performing chart review and documentation.   Milas Hock, MD Attending Obstetrician & Gynecologist, [facility] for [facility], [facility]

## 2022-05-17 NOTE — ED Triage Notes (Signed)
Pt presents to ED POV. Pt c/o abd pain and vaginal bleeding since yesterday. Pt had [place] today baby not visualized. Here for possible ectopic. Pt reports bleeding is more like spotting not profuse. Pt reports she had blood work done 2w ago that showed she was 3-4w pregnant. LMP 04/02/22.

## 2022-05-17 NOTE — ED Notes (Signed)
Patient voiced concern to staff about having to leave at this time prior to completing care due to family matters. EDP made aware and at bedside.

## 2022-05-20 ENCOUNTER — Ambulatory Visit (INDEPENDENT_AMBULATORY_CARE_PROVIDER_SITE_OTHER): Payer: BC Managed Care – PPO | Admitting: General Practice

## 2022-05-20 VITALS — BP 115/68 | HR 89 | Ht 63.0 in | Wt 228.0 lb

## 2022-05-20 DIAGNOSIS — Z3A00 Weeks of gestation of pregnancy not specified: Secondary | ICD-10-CM

## 2022-05-20 DIAGNOSIS — O3680X0 Pregnancy with inconclusive fetal viability, not applicable or unspecified: Secondary | ICD-10-CM

## 2022-05-20 LAB — GC/CHLAMYDIA PROBE AMP (~~LOC~~) NOT AT ARMC
Chlamydia: NEGATIVE
Comment: NEGATIVE
Comment: NORMAL
Neisseria Gonorrhea: NEGATIVE

## 2022-05-20 LAB — BETA HCG QUANT (REF LAB): hCG Quant: 44 m[IU]/mL

## 2022-05-20 NOTE — Progress Notes (Signed)
Beta HCG Follow-up Visit  Victoria Olson presents to [facility] for follow-up beta HCG lab. She was seen in ED for vaginal bleeding & pelvic pain on 12/15. Patient reports  heavier bleeding over the weekend with passing of clots & tissue. She reports intermittent light to heavy bleeding this morning and denies pain  today. Discussed with patient that we are following beta HCG levels today. Results will be back in approximately 2 hours. Valid contact number for patient confirmed. I will call the patient with results.   Beta HCG results:        12/15          286         12/18           44      Results and patient history reviewed with Dr Alysia Penna, who states decreasing bhcg levels are indicative of SAB. Patient should follow up in 2 weeks for repeat bhcg or follow up with ob/gyn provider. Patient called and informed of plan for follow-up. Patient verbalized understanding and will follow up with [facility].  [name] 05/20/2022 8:33 AM

## 2023-02-21 ENCOUNTER — Encounter: Payer: Self-pay | Admitting: Family Medicine

## 2023-02-21 ENCOUNTER — Ambulatory Visit: Payer: BC Managed Care – PPO | Admitting: Family Medicine

## 2023-02-21 VITALS — BP 120/70 | HR 100 | Temp 98.8°F | Resp 12 | Ht 63.0 in | Wt 217.1 lb

## 2023-02-21 DIAGNOSIS — R7303 Prediabetes: Secondary | ICD-10-CM

## 2023-02-21 DIAGNOSIS — E669 Obesity, unspecified: Secondary | ICD-10-CM | POA: Insufficient documentation

## 2023-02-21 DIAGNOSIS — Z6838 Body mass index (BMI) 38.0-38.9, adult: Secondary | ICD-10-CM

## 2023-02-21 DIAGNOSIS — K529 Noninfective gastroenteritis and colitis, unspecified: Secondary | ICD-10-CM | POA: Insufficient documentation

## 2023-02-21 DIAGNOSIS — E559 Vitamin D deficiency, unspecified: Secondary | ICD-10-CM | POA: Diagnosis not present

## 2023-02-21 DIAGNOSIS — E66812 Obesity, class 2: Secondary | ICD-10-CM | POA: Insufficient documentation

## 2023-02-21 DIAGNOSIS — M359 Systemic involvement of connective tissue, unspecified: Secondary | ICD-10-CM

## 2023-02-21 LAB — TSH: TSH: 1.43 u[IU]/mL (ref 0.35–5.50)

## 2023-02-21 MED ORDER — DICYCLOMINE HCL 10 MG PO CAPS: 10.0000 mg | ORAL_CAPSULE | Freq: Three times a day (TID) | ORAL | 0 refills | Status: AC

## 2023-02-21 NOTE — Assessment & Plan Note (Signed)
Currently on Plaquenil 200 mg bid. Follows with Dr. Deanne Coffer, rheumatologist every 6 months.

## 2023-02-21 NOTE — Progress Notes (Signed)
Chief Complaint  Patient presents with   Establish Care    Recently having a lot of stomach upset, her rheumatologist ran blood work and has placed referral to gastro to eval for IBS. Was having swelling in her leg and knee, but has seemed to be able to manage this at home with her workout program, ice, and knee brace; did see ortho for this.    HPI: [name] is a 32 y.o. female, who is here today to establish care.  Former PCP: Victoria Mis, MD Last preventive routine visit: 12/31/2022  Hx of connective tissue disease: She reports she is taking plaquenil 200 mg bid. She sees rheumatology for this every 6/months, Dr. Deanne Olson.  Vision: UTD on routine vision care Dental: UTD on routine dental care  Alcohol use: She reports occasional drinking.  Exercise: She follows a workout plan in her living room 5x/week. Smoking history: never  Prediabetes: Last HgA1C improved, on 12/31/22 it was 5,5 (5.8). Negative for polydipsia,polyuria, or polyphagia.  -B12,B1,and Vitamin D deficiencies: She takes 1000 mg B1 daily and vit D occasionally. She doesn't take them always because she doesn't like the taste and reports vit D supplementation causes an odor in her urine.  States that her vit D has been as low as 7.0.  -Today she is c/o periumbilical abdominal pain right after she eats for the last 3 months. She describes the pain as a "cramping pain" with any type of food.  She endorses "mushy" and loose bowel movements and flatulence. She has about 2 bowel movements per day.  She states that she has been meal prepping for the last 6 weeks in an effort to eat out less but does not seem to be helping with symptoms.  She denies stool urgency or incontinence, nausea, vomiting, heartburn, unusual stress or new medications.  She has not noted blood in stool or melena. Fhx negative for IBD. She has not tried OTC medications. Denies unusual stress or traumatic event.  He rheumatologist has  referred her to GI, appt is pending.  Obesity:She follows up with a weight loss clinic.  She is taking phentermine 18.75 mg daily and topiramate 50 mg daily.   Review of Systems  Constitutional:  Negative for activity change, appetite change and fever.  HENT:  Negative for mouth sores, sore throat and trouble swallowing.   Respiratory:  Negative for cough, shortness of breath and wheezing.   Cardiovascular:  Negative for chest pain and palpitations.  Endocrine: Negative for cold intolerance and heat intolerance.  Genitourinary:  Negative for decreased urine volume, dysuria, hematuria and pelvic pain.  Musculoskeletal:  Positive for arthralgias.  Skin:  Negative for rash.  Neurological:  Negative for syncope, weakness and headaches.  Psychiatric/Behavioral:  Negative for confusion. The patient is not nervous/anxious.   See other pertinent positives and negatives in HPI.  Current Outpatient Medications on File Prior to Visit  Medication Sig Dispense Refill   hydroxychloroquine (PLAQUENIL) 200 MG tablet Take 200 mg by mouth 2 (two) times daily.     phentermine (ADIPEX-P) 37.5 MG tablet Take 0.5 tablets by mouth daily before breakfast.     topiramate (TOPAMAX) 50 MG tablet Take 50 mg by mouth 2 (two) times daily.     No current facility-administered medications on file prior to visit.   Past Medical History:  Diagnosis Date   Allergic reaction    Anemia    Genital herpes    Hives    Hx of trichomonal vaginitis  Lupus (HCC) 2019   Allergies  Allergen Reactions   Metronidazole Nausea And Vomiting   Hydrocodone Hives   Family History  Problem Relation Age of Onset   Hypertension Father    Cancer Mother        uterine   Diabetes Maternal Uncle    Hypertension Maternal Grandfather    Stroke Maternal Grandfather    Hypertension Paternal Grandfather    Heart disease Paternal Grandfather    Stroke Paternal Grandfather    Hearing loss Neg Hx     Social History    Socioeconomic History   Marital status: Single    Spouse name: Not on file   Number of children: Not on file   Years of education: Not on file   Highest education level: Not on file  Occupational History   Not on file  Tobacco Use   Smoking status: Never   Smokeless tobacco: Never  Vaping Use   Vaping status: Never Used  Substance and Sexual Activity   Alcohol use: Yes    Comment: occas   Drug use: No   Sexual activity: Yes    Birth control/protection: Pill  Other Topics Concern   Not on file  Social History Narrative   Are you right handed or left handed? right   Are you currently employed ? 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 With two kids   Caffeine none   What type of home do you live in: 1 story or 2 story? 1 story       Social Determinants of Health   Financial Resource Strain: Low Risk  (12/28/2022)   Received from [facility]   Overall Financial Resource Strain (CARDIA)    Difficulty of Paying Living Expenses: Not hard at all  Food Insecurity: No Food Insecurity (12/28/2022)   Received from [facility]   Hunger Vital Sign    Worried About Running Out of Food in the Last Year: Never true    Ran Out of Food in the Last Year: Never true  Transportation Needs: No Transportation Needs (12/28/2022)   Received from [facility] - Transportation    Lack of Transportation (Medical): No    Lack of Transportation (Non-Medical): No  Physical Activity: Insufficiently Active (12/28/2022)   Received from [facility]   Exercise Vital Sign    Days of Exercise per Week: 1 day    Minutes of Exercise per Session: 20 min  Stress: No Stress Concern Present (12/28/2022)   Received from [facility] - Occupational Stress Questionnaire    Feeling of Stress : Only a little  Social Connections: Moderately Integrated (08/14/2022)   Received from [facility]   Social  Network    How would you rate your social network (family, work, friends)?: Adequate participation with social networks    Vitals:   02/21/23 1355  BP: 120/70  Pulse: 100  Resp: 12  Temp: 98.8 F (37.1 C)  SpO2: 99%   Body mass index is 38.46 kg/m.  Physical Exam Vitals and nursing note reviewed.  Constitutional:      General: She is not in acute distress.    Appearance: She is well-developed.  HENT:     Head: Normocephalic and atraumatic.     Mouth/Throat:     Mouth: Mucous membranes are moist.     Pharynx: Oropharynx is clear.  Eyes:  Conjunctiva/sclera: Conjunctivae normal.  Cardiovascular:     Rate and Rhythm: Normal rate and regular rhythm.     Heart sounds: No murmur heard. Pulmonary:     Effort: Pulmonary effort is normal. No respiratory distress.     Breath sounds: Normal breath sounds.  Abdominal:     Palpations: Abdomen is soft. There is no hepatomegaly or mass.     Tenderness: There is no abdominal tenderness.  Lymphadenopathy:     Cervical: No cervical adenopathy.  Skin:    General: Skin is warm.     Findings: No erythema or rash.  Neurological:     General: No focal deficit present.     Mental Status: She is alert and oriented to person, place, and time.     Cranial Nerves: No cranial nerve deficit.     Gait: Gait normal.  Psychiatric:        Mood and Affect: Mood and affect normal.    ASSESSMENT AND PLAN:  Ms. Dimmock was seen today to establish care.   Lab Results  Component Value Date   TSH 1.43 02/21/2023   Prediabetes Assessment & Plan: Continue a healthy lifestyle consistently for diabetes prevention.   Chronic diarrhea Assessment & Plan: 3 months. We discussed possible etiologies. ? IBS-D. Pending appt with GI. Recommend trying los fiber diet and Bentyl 10 mg tid before meals. Ova-parasite stool examination ordered as well as TSH. Continue adequate hydration.  Orders: -     TSH; Future -     Ova and parasite  examination; Future -     Dicyclomine HCl; Take 1 capsule (10 mg total) by mouth 3 (three) times daily before meals.  Dispense: 45 capsule; Refill: 0  Vitamin D deficiency, unspecified Assessment & Plan: 25 OH vit D 24.5 on 12/31/22. Continue OTC vit D, try to take medication daily.   Connective tissue disease (HCC) Assessment & Plan: Currently on Plaquenil 200 mg bid. Follows with Dr. Deanne Olson, rheumatologist every 6 months.   Class 2 severe obesity with serious comorbidity and body mass index (BMI) of 38.0 to 38.9 in adult, unspecified obesity type [facility]) Assessment & Plan: Currently on Phentermine and Topiramate. Follows with wt loss clinic.    Return in about 1 year (around 02/21/2024), or if symptoms worsen or fail to improve.  I, Rolla Etienne Wierda, acting as a scribe for Betty Swaziland, MD., have documented all relevant documentation on the behalf of Betty Swaziland, MD, as directed by  Betty Swaziland, MD while in the presence of Betty Swazil[place], MD.   I, Betty Swaziland, MD, have reviewed all documentation for this visit. The documentation on 02/21/23 for the exam, diagnosis, procedures, and orders are all accurate and complete.  Betty G. Swaziland, MD  [facility]. [place] office.

## 2023-02-21 NOTE — Assessment & Plan Note (Signed)
25 OH vit D 24.5 on 12/31/22. Continue OTC vit D, try to take medication daily.

## 2023-02-21 NOTE — Assessment & Plan Note (Signed)
3 months. We discussed possible etiologies. ? IBS-D. Pending appt with GI. Recommend trying los fiber diet and Bentyl 10 mg tid before meals. Ova-parasite stool examination ordered as well as TSH. Continue adequate hydration.

## 2023-02-21 NOTE — Assessment & Plan Note (Signed)
Continue a healthy lifestyle consistently for diabetes prevention.

## 2023-02-21 NOTE — Patient Instructions (Addendum)
A few things to remember from today's visit:  Chronic diarrhea - Plan: TSH, Ova and parasite examination  Encounter for HCV screening test for low risk patient Pending appt with gastroenterologist. Bentyl before meals. Continue adequate hydration. Low residue diet may help.  If you need refills for medications you take chronically, please call your pharmacy. Do not use My Chart to request refills or for acute issues that need immediate attention. If you send a my chart message, it may take a few days to be addressed, specially if I am not in the office.  Please be sure medication list is accurate. If a new problem present, please set up appointment sooner than planned today.

## 2023-02-21 NOTE — Assessment & Plan Note (Signed)
Currently on Phentermine and Topiramate. Follows with wt loss clinic.

## 2023-02-24 ENCOUNTER — Other Ambulatory Visit: Payer: BC Managed Care – PPO

## 2023-02-24 DIAGNOSIS — K529 Noninfective gastroenteritis and colitis, unspecified: Secondary | ICD-10-CM

## 2023-03-04 LAB — OVA AND PARASITE EXAMINATION
CONCENTRATE RESULT:: NONE SEEN
MICRO NUMBER:: 15502377
SPECIMEN QUALITY:: ADEQUATE
TRICHROME RESULT:: NONE SEEN

## 2023-06-09 ENCOUNTER — Encounter: Payer: Self-pay | Admitting: Family Medicine

## 2023-06-09 ENCOUNTER — Ambulatory Visit (INDEPENDENT_AMBULATORY_CARE_PROVIDER_SITE_OTHER): Payer: BLUE CROSS/BLUE SHIELD | Admitting: Family Medicine

## 2023-06-09 VITALS — BP 120/70 | HR 100 | Temp 98.5°F | Resp 12 | Ht 63.0 in | Wt 203.5 lb

## 2023-06-09 DIAGNOSIS — Z1159 Encounter for screening for other viral diseases: Secondary | ICD-10-CM | POA: Diagnosis not present

## 2023-06-09 DIAGNOSIS — R809 Proteinuria, unspecified: Secondary | ICD-10-CM

## 2023-06-09 DIAGNOSIS — Z113 Encounter for screening for infections with a predominantly sexual mode of transmission: Secondary | ICD-10-CM

## 2023-06-09 DIAGNOSIS — N898 Other specified noninflammatory disorders of vagina: Secondary | ICD-10-CM

## 2023-06-09 DIAGNOSIS — R8290 Unspecified abnormal findings in urine: Secondary | ICD-10-CM | POA: Diagnosis not present

## 2023-06-09 LAB — POC URINALSYSI DIPSTICK (AUTOMATED)
Bilirubin, UA: NEGATIVE
Blood, UA: NEGATIVE
Glucose, UA: NEGATIVE
Ketones, UA: POSITIVE
Leukocytes, UA: NEGATIVE
Nitrite, UA: POSITIVE
Protein, UA: POSITIVE — AB
Urobilinogen, UA: 0.2 U/dL
pH, UA: 6 (ref 5.0–8.0)

## 2023-06-09 NOTE — Progress Notes (Signed)
 ACUTE VISIT Chief Complaint  Patient presents with   urine odor   HPI: [name] is a 33 y.o. female with a PMHx significant for chronic diarrhea, prediabetes, connective tissue disease, and vitamin D deficiency, who is here today complaining of urine odor.   Patient complains of a urinary odor that started in 03/2023. It does not happen every time she urinates, but does the majority of times.  She has not identified exacerbating or alleviating factors. She also mentions her urine has been darker than usual, and is occasionally cloudy when she does not drink much water/fluids.  She has also had noted vaginal discharge on tissue when wiping after she urinates.  She has been told she has protein in her urine before.  Negative for gross hematuria,foam in urine,or edema. Follows with wt loss clinic, on phentermine, states that she has not had blood work done.  She has not taken anything OTC for this problem.  Denies any burning sensation, increased urinary frequency  or urgency, incontinence, abdominal pain, fever, or chills.   LMP: 3-4 weeks ago. She has a gynecologist she sees regularly, and last saw her on 04/09/2023. Recently underwent pelvic/transvaginal US .  She would like STD screening today. No known exposure.  Review of Systems  Constitutional:  Negative for activity change, appetite change and unexpected weight change.  HENT:  Negative for mouth sores and sore throat.   Respiratory:  Negative for shortness of breath.   Cardiovascular:  Negative for chest pain and palpitations.  Gastrointestinal:  Negative for abdominal pain, nausea and vomiting.  Genitourinary:  Positive for menstrual problem. Negative for decreased urine volume, dysuria and vaginal pain.  Musculoskeletal:  Negative for arthralgias and joint swelling.  Skin:  Negative for rash.  Neurological:  Negative for weakness and headaches.  See other pertinent positives and negatives in HPI.  Current  Outpatient Medications on File Prior to Visit  Medication Sig Dispense Refill   dicyclomine  (BENTYL ) 10 MG capsule Take 1 capsule (10 mg total) by mouth 3 (three) times daily before meals. 45 capsule 0   hydroxychloroquine (PLAQUENIL) 200 MG tablet Take 200 mg by mouth 2 (two) times daily.     phentermine (ADIPEX-P) 37.5 MG tablet Take 0.5 tablets by mouth daily before breakfast.     topiramate (TOPAMAX) 50 MG tablet Take 50 mg by mouth 2 (two) times daily.     No current facility-administered medications on file prior to visit.   Past Medical History:  Diagnosis Date   Allergic reaction    Anemia    Genital herpes    Hives    Hx of trichomonal vaginitis    Lupus 2019   Allergies  Allergen Reactions   Metronidazole  Nausea And Vomiting   Hydrocodone Hives    Social History   Socioeconomic History   Marital status: Single    Spouse name: Not on file   Number of children: Not on file   Years of education: Not on file   Highest education level: Not on file  Occupational History   Not on file  Tobacco Use   Smoking status: Never   Smokeless tobacco: Never  Vaping Use   Vaping status: Never Used  Substance and Sexual Activity   Alcohol use: Yes    Comment: occas   Drug use: No   Sexual activity: Yes    Birth control/protection: Pill  Other Topics Concern   Not on file  Social History Narrative   Are you right handed  or left handed? right   Are you currently employed ? 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 With two kids   Caffeine none   What type of home do you live in: 1 story or 2 story? 1 story       Social Drivers of Corporate Investment Banker Strain: Low Risk  (12/28/2022)   Received from [facility]   Overall Financial Resource Strain (CARDIA)    Difficulty of Paying Living Expenses: Not hard at all  Food Insecurity: No Food Insecurity (12/28/2022)   Received from [facility]   Hunger Vital Sign     Worried About Running Out of Food in the Last Year: Never true    Ran Out of Food in the Last Year: Never true  Transportation Needs: No Transportation Needs (12/28/2022)   Received from [facility] - Transportation    Lack of Transportation (Medical): No    Lack of Transportation (Non-Medical): No  Physical Activity: Insufficiently Active (12/28/2022)   Received from [facility]   Exercise Vital Sign    Days of Exercise per Week: 1 day    Minutes of Exercise per Session: 20 min  Stress: No Stress Concern Present (12/28/2022)   Received from [facility] - Occupational Stress Questionnaire    Feeling of Stress : Only a little  Social Connections: Moderately Integrated (08/14/2022)   Received from [facility]   Social Network    How would you rate your social network (family, work, friends)?: Adequate participation with social networks    Vitals:   06/09/23 1438  BP: 120/70  Pulse: 100  Resp: 12  Temp: 98.5 F (36.9 C)  SpO2: 99%   Body mass index is 36.05 kg/m.  Physical Exam Vitals and nursing note reviewed.  Constitutional:      General: She is not in acute distress.    Appearance: She is well-developed.  HENT:     Head: Normocephalic and atraumatic.     Mouth/Throat:     Mouth: Mucous membranes are moist.     Pharynx: Oropharynx is clear.  Eyes:     Conjunctiva/sclera: Conjunctivae normal.  Cardiovascular:     Rate and Rhythm: Normal rate and regular rhythm.     Heart sounds: No murmur heard. Pulmonary:     Effort: Pulmonary effort is normal. No respiratory distress.     Breath sounds: Normal breath sounds.  Abdominal:     Palpations: Abdomen is soft. There is no hepatomegaly or mass.     Tenderness: There is no abdominal tenderness.  Genitourinary:    Comments: Deferred to her gynecologist. Musculoskeletal:     Right lower leg: No edema.     Left lower leg: No edema.  Lymphadenopathy:      Cervical: No cervical adenopathy.  Skin:    General: Skin is warm.     Findings: No erythema or rash.  Neurological:     General: No focal deficit present.     Mental Status: She is alert and oriented to person, place, and time.     Cranial Nerves: No cranial nerve deficit.     Gait: Gait normal.  Psychiatric:        Mood and Affect: Mood and affect normal.   ASSESSMENT AND PLAN:  Ms. Yono was seen today for urine odor.   Abnormal urine odor We discussed possible etiologies.  Urine dipstick today positive for ketones,protein,and nitrite. Hx and examination do not suggest a serious systemic illness. Recommend consistency with adequate hydration, enough to have clear urine. Further recommendations according to lab results.  -     POCT Urinalysis Dipstick (Automated) -     Urine Culture; Future -     Hemoglobin A1c; Future -     Urine cytology ancillary only  Vaginal discharge ? Physiologic, BV among some to consider. Could be contributing to odorous urine. Further recommendations according to lab results.  -     Urine cytology ancillary only  Proteinuria, unspecified type Protein has been present in urine , 05/17/22 and today. Further recommendations according to lab results.  -     Microalbumin / creatinine urine ratio; Future -     Basic metabolic panel; Future  Encounter for HCV screening test for low risk patient -     Hepatitis C antibody; Future  Screen for STD (sexually transmitted disease) -     Urine cytology ancillary only -     RPR; Future -     HIV Antibody (routine testing w rflx); Future  Return if symptoms worsen or fail to improve, for keep next appointment.  I, Leonce PARAS Wierda, acting as a scribe for Jakin Pavao, MD., have documented all relevant documentation on the behalf of Drayton Tieu, MD, as directed by  Brnadon Eoff, MD while in the presence of Cherilynn Schomburg, MD.   I, Lenord Fralix, MD, have reviewed all documentation for this visit. The  documentation on 06/09/23 for the exam, diagnosis, procedures, and orders are all accurate and complete.  Laverta Harnisch G. Bard Haupert, MD  [facility]. [place] office.

## 2023-06-09 NOTE — Patient Instructions (Addendum)
 A few things to remember from today's visit:  Abnormal urine odor - Plan: POCT Urinalysis Dipstick (Automated), Culture, Urine, Hemoglobin A1c, Urine cytology ancillary only  Screen for STD (sexually transmitted disease) - Plan: Urine cytology ancillary only, RPR, HIV Antibody (routine testing w rflx)  Vaginal discharge - Plan: Urine cytology ancillary only  Proteinuria, unspecified type - Plan: Microalbumin / creatinine urine ratio, Basic metabolic panel  Encounter for HCV screening test for low risk patient - Plan: Hepatitis C antibody  Be sure to drink enough fluid that your urine is clear.  Do not use My Chart to request refills or for acute issues that need immediate attention. If you send a my chart message, it may take a few days to be addressed, specially if I am not in the office.  Please be sure medication list is accurate. If a new problem present, please set up appointment sooner than planned today.

## 2023-06-10 LAB — BASIC METABOLIC PANEL
BUN: 10 mg/dL (ref 6–23)
CO2: 22 meq/L (ref 19–32)
Calcium: 9.2 mg/dL (ref 8.4–10.5)
Chloride: 108 meq/L (ref 96–112)
Creatinine, Ser: 0.76 mg/dL (ref 0.40–1.20)
GFR: 103.63 mL/min (ref >60.00)
Glucose, Bld: 80 mg/dL (ref 70–99)
Potassium: 3.8 meq/L (ref 3.5–5.1)
Sodium: 138 meq/L (ref 135–145)

## 2023-06-10 LAB — HEMOGLOBIN A1C: Hgb A1c MFr Bld: 6 % (ref 4.6–6.5)

## 2023-06-10 LAB — MICROALBUMIN / CREATININE URINE RATIO
Creatinine,U: 219 mg/dL
Microalb Creat Ratio: 0.4 mg/g (ref 0.0–30.0)
Microalb, Ur: 0.9 mg/dL (ref 0.0–1.9)

## 2023-06-11 LAB — URINE CULTURE
MICRO NUMBER:: 15921700
SPECIMEN QUALITY:: ADEQUATE

## 2023-06-11 LAB — HEPATITIS C ANTIBODY: Hepatitis C Ab: NONREACTIVE

## 2023-06-11 LAB — RPR: RPR Ser Ql: NONREACTIVE

## 2023-06-11 LAB — HIV ANTIBODY (ROUTINE TESTING W REFLEX): HIV 1&2 Ab, 4th Generation: NONREACTIVE

## 2023-06-12 ENCOUNTER — Other Ambulatory Visit: Payer: Self-pay | Admitting: Family Medicine

## 2023-06-12 MED ORDER — NITROFURANTOIN MONOHYD MACRO 100 MG PO CAPS: 100.0000 mg | ORAL_CAPSULE | Freq: Two times a day (BID) | ORAL | 0 refills | Status: AC

## 2023-12-01 ENCOUNTER — Ambulatory Visit: Payer: Self-pay | Admitting: *Deleted

## 2023-12-01 NOTE — Telephone Encounter (Signed)
 Message from Victoria Olson sent at 12/01/2023  8:26 AM EDT  Red Word that prompted transfer to Nurse Triage: Patient is calling she thinks she has a uti, has strong smell she is stating that she had one before and she thinks that this is what this is.    Call History  Contact Date/Time Type Contact Phone/Fax By  12/01/2023 08:23 AM EDT Phone (Incoming) Milta Sallies D (Self) [phone] Georgina Charlet RAMAN   Reason for Disposition  Bad or foul-smelling urine  Answer Assessment - Initial Assessment Questions 1. SYMPTOM: What's the main symptom you're concerned about? (e.g., frequency, incontinence)     I have a smell to my urine like when I was in last time.  I had a UTI. 2. ONSET: When did the  smell  start?     It's been over a month.   I'Olson drinking water and cranberry juice. The last time I had a UTI it was stronger smelling than this time.    3. PAIN: Is there any pain? If Yes, ask: How bad is it? (Scale: 1-10; mild, moderate, severe)     No burning with urination.      No abd pain or pressure. 4. CAUSE: What do you think is causing the symptoms?     UTI 5. OTHER SYMPTOMS: Do you have any other symptoms? (e.g., blood in urine, fever, flank pain, pain with urination)     No flank pain 6. PREGNANCY: Is there any chance you are pregnant? When was your last menstrual period?     No  Protocols used: Urinary Symptoms-A-AH FYI Only or Action Required?: FYI only for provider.  Patient was last seen in primary care on 06/09/2023 by [place], Betty G, MD. Called Nurse Triage reporting Dysuria. Symptoms began about a month ago. Interventions attempted: Rest, hydration, or home remedies. Symptoms are: unchanged smell to urine like UTI in Jan 2025.  Triage Disposition: See Within 2 Weeks in Clinical biochemist understands and will follow disposition?: Yes Requested appt at 7:30 AM because she drops her kids off at 7:00 AM.   I went over signs/symptoms to call back if they  occur since it's a week until she is seen:  fever over 101, flank pain on either side, burning or smell becomes stronger.   Continue drinking fluids and cranberry juice as she is doing now.  Pt agreeable to this plan.

## 2023-12-09 ENCOUNTER — Other Ambulatory Visit (HOSPITAL_COMMUNITY)
Admission: RE | Admit: 2023-12-09 | Discharge: 2023-12-09 | Disposition: A | Discharge status: Home / Self Care | Source: Ambulatory Visit | Attending: Family Medicine | Admitting: Family Medicine

## 2023-12-09 ENCOUNTER — Ambulatory Visit: Payer: Self-pay | Admitting: Family Medicine

## 2023-12-09 ENCOUNTER — Encounter: Payer: Self-pay | Admitting: Family Medicine

## 2023-12-09 VITALS — BP 122/80 | HR 100 | Resp 12 | Ht 63.0 in | Wt 189.4 lb

## 2023-12-09 DIAGNOSIS — N898 Other specified noninflammatory disorders of vagina: Secondary | ICD-10-CM

## 2023-12-09 DIAGNOSIS — R8290 Unspecified abnormal findings in urine: Secondary | ICD-10-CM | POA: Diagnosis not present

## 2023-12-09 DIAGNOSIS — F419 Anxiety disorder, unspecified: Secondary | ICD-10-CM | POA: Diagnosis not present

## 2023-12-09 LAB — POC URINALSYSI DIPSTICK (AUTOMATED)
Bilirubin, UA: NEGATIVE
Blood, UA: NEGATIVE
Glucose, UA: NEGATIVE
Ketones, UA: NEGATIVE
Nitrite, UA: NEGATIVE
Protein, UA: NEGATIVE
Urobilinogen, UA: 0.2 U/dL
pH, UA: 6.5 (ref 5.0–8.0)

## 2023-12-09 MED ORDER — SERTRALINE HCL 50 MG PO TABS: 50.0000 mg | ORAL_TABLET | Freq: Every day | ORAL | 2 refills | Status: AC

## 2023-12-09 NOTE — Progress Notes (Signed)
 ACUTE VISIT Chief Complaint  Patient presents with   urine odor   HPI: [name] is a 33 y.o. female, who is here today complaining of odorous urine ongoing 2 months.  Her symptoms tend to improve when she increases her daily water intake.   She also endorses changes to her vaginal discharge, recently has been heavier.  Reports her discharge has appeared white and yeasty around her menses as well as a clear and mucus-like. She states this has previously occurred in the past; usually after sexual intercourse.  She is currently sexually active.   Per pt, she is established with gynecology at [facility]. Her LMP was 11/20/2023.  Hx of STDs including chlamydia and trichomoniasis.  Denies any vaginal washes or douching, frequency, dysuria, or vaginal itching,   Anxiety: She also complains of anxiety, ongoing for 3 years and gradually getting worse.  She was previously prescribed Buspar 10 mg daily, which she discontinued due to intolerances.  Effexor was then initiated, which did help her anxiety, but aggravated concentration.  She was following up with a NP behavioral health specialist, but had to stop being seen due to her insurance no longer being covered.  She has not been able to re-establish care, stating her appt to be seen again is scheduled in September/2025.   Review of Systems  Constitutional:  Negative for activity change, appetite change and fever.  Respiratory:  Negative for shortness of breath.   Cardiovascular:  Negative for chest pain, palpitations and leg swelling.  Gastrointestinal:  Negative for abdominal pain, nausea and vomiting.  Genitourinary:  Negative for decreased urine volume, flank pain, genital sores and vaginal bleeding.  Skin:  Negative for rash.  Neurological:  Negative for syncope and weakness.  Psychiatric/Behavioral:  Negative for confusion, hallucinations and suicidal ideas.   See other pertinent positives and negatives in  HPI.  Current Outpatient Medications on File Prior to Visit  Medication Sig Dispense Refill   dicyclomine  (BENTYL ) 10 MG capsule Take 1 capsule (10 mg total) by mouth 3 (three) times daily before meals. 45 capsule 0   hydroxychloroquine (PLAQUENIL) 200 MG tablet Take 200 mg by mouth 2 (two) times daily.     phentermine (ADIPEX-P) 37.5 MG tablet Take 0.5 tablets by mouth daily before breakfast.     topiramate (TOPAMAX) 50 MG tablet Take 50 mg by mouth 2 (two) times daily.     No current facility-administered medications on file prior to visit.   Past Medical History:  Diagnosis Date   Allergic reaction    Anemia    Genital herpes    Hives    Hx of trichomonal vaginitis    Lupus 2019   Allergies  Allergen Reactions   Metronidazole  Nausea And Vomiting   Hydrocodone Hives    Social History   Socioeconomic History   Marital status: Single    Spouse name: Not on file   Number of children: Not on file   Years of education: Not on file   Highest education level: Not on file  Occupational History   Not on file  Tobacco Use   Smoking status: Never   Smokeless tobacco: Never  Vaping Use   Vaping status: Never Used  Substance and Sexual Activity   Alcohol use: Yes    Comment: occas   Drug use: No   Sexual activity: Yes    Birth control/protection: Pill  Other Topics Concern   Not on file  Social History Narrative   Are you  right handed or left handed? right   Are you currently employed ? 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 With two kids   Caffeine none   What type of home do you live in: 1 story or 2 story? 1 story       Social Drivers of Corporate investment banker Strain: Low Risk  (08/12/2023)   Received from [facility]   Overall Financial Resource Strain (CARDIA)    Difficulty of Paying Living Expenses: Not hard at all  Food Insecurity: No Food Insecurity (08/12/2023)   Received from [facility]    Hunger Vital Sign    Within the past 12 months, you worried that your food would run out before you got the money to buy more.: Never true    Within the past 12 months, the food you bought just didn't last and you didn't have money to get more.: Never true  Transportation Needs: Unmet Transportation Needs (08/12/2023)   Received from [facility] - Transportation    Lack of Transportation (Medical): Yes    Lack of Transportation (Non-Medical): Yes  Physical Activity: Inactive (08/12/2023)   Received from [facility]   Exercise Vital Sign    On average, how many days per week do you engage in moderate to strenuous exercise (like a brisk walk)?: 0 days    On average, how many minutes do you engage in exercise at this level?: 20 min  Stress: Stress Concern Present (08/12/2023)   Received from [facility] - Occupational Stress Questionnaire    Feeling of Stress : Rather much  Social Connections: Socially Integrated (08/12/2023)   Received from [facility]   Social Network    How would you rate your social network (family, work, friends)?: Good participation with social networks    Vitals:   12/09/23 0740  BP: 122/80  Pulse: 100  Resp: 12  SpO2: 99%   Body mass index is 33.55 kg/m.  Physical Exam Vitals and nursing note reviewed.  Constitutional:      General: She is not in acute distress.    Appearance: She is well-developed.  HENT:     Head: Normocephalic and atraumatic.  Eyes:     Conjunctiva/sclera: Conjunctivae normal.  Cardiovascular:     Rate and Rhythm: Normal rate and regular rhythm.     Heart sounds: No murmur heard. Pulmonary:     Effort: Pulmonary effort is normal. No respiratory distress.     Breath sounds: Normal breath sounds.  Abdominal:     Palpations: Abdomen is soft. There is no mass.     Tenderness: There is no abdominal tenderness. There is no right CVA tenderness or left CVA tenderness.   Genitourinary:    Comments: Deferred to her gyn. Skin:    General: Skin is warm.     Findings: No erythema.  Neurological:     General: No focal deficit present.     Mental Status: She is alert and oriented to person, place, and time.     Gait: Gait normal.  Psychiatric:        Mood and Affect: Mood and affect normal.        Thought Content: Thought content does not include homicidal or suicidal ideation. Thought content does not include homicidal or suicidal plan.    ASSESSMENT AND PLAN: Ms. KAITLIN ALCINDOR was seen today for  odorous urine.  Abnormal urine odor No associated urinary symptoms. We discussed possible etiologies. History does not suggest a urine infectious process. Urine dipstick done today negative except for traces of leukocytes. Urine was not sent for culture. Recommend increasing water intake, which seems to help. Monitor for new symptoms, if any, we can repeat urinalysis.  -     POCT Urinalysis Dipstick (Automated)  Vaginal discharge We discussed possible etiologies, including physiologic discharge. She has had BV in the past and does not believe this is a problem at this time. If problem is persistent, recommend following with her gynecologist. Further recommendation will be given according to lab results.  -     Urine cytology ancillary only  Anxiety disorder, unspecified type Assessment & Plan: In the past she has tried BuSpar and Effexor, discontinued medication because of side effects with the former one and did not feel like Effexor was effective. We discussed other treatment options, she agrees with trying sertraline  starting with 25 mg daily and increasing dose to 50 mg in 2 weeks if well-tolerated. We discussed son side effects. She has an appointment with psychiatrist for 02/2024. Follow-up in 6 weeks, before if needed.  Orders: -     Sertraline  HCl; Take 1 tablet (50 mg total) by mouth daily.  Dispense: 30 tablet; Refill: 2  Return in  about 6 weeks (around 01/20/2024).   I, Vernell Forest, acting as a scribe for Jodine Muchmore [place], MD., have documented all relevant documentation on the behalf of Jaylinn Hellenbrand [place], MD, as directed by   while in the presence of Jovann Luse [place], MD.  I, Wannetta Langland [place], MD, have reviewed all documentation for this visit. The documentation on 12/09/23 for the exam, diagnosis, procedures, and orders are all accurate and complete.  Monet North G. [place], MD  [facility]. [place] office.

## 2023-12-09 NOTE — Assessment & Plan Note (Addendum)
 In the past she has tried BuSpar and Effexor, discontinued medication because of side effects with the former one and did not feel like Effexor was effective. We discussed other treatment options, she agrees with trying sertraline  starting with 25 mg daily and increasing dose to 50 mg in 2 weeks if well-tolerated. We discussed son side effects. She has an appointment with psychiatrist for 02/2024. Follow-up in 6 weeks, before if needed.

## 2023-12-09 NOTE — Patient Instructions (Signed)
 A few things to remember from today's visit:  Abnormal urine odor - Plan: POCT Urinalysis Dipstick (Automated)  Vaginal discharge - Plan: Urine cytology ancillary only  Anxiety disorder, unspecified type - Plan: sertraline  (ZOLOFT ) 50 MG tablet  Today we started Sertraline , this type of medications can increase suicidal risk.  Start 1/2 tab and increase to whole tab in 2 weeks. This is more prevalent among children,adolecents, and young adults with major depression or other psychiatric disorders. It can also make depression worse. Most common side effects are gastrointestinal, self limited after a few weeks: diarrhea, nausea, constipation  Or diarrhea among some.  In general it is well tolerated. We will follow closely.  If you need refills for medications you take chronically, please call your pharmacy. Do not use My Chart to request refills or for acute issues that need immediate attention. If you send a my chart message, it may take a few days to be addressed, specially if I am not in the office.  Please be sure medication list is accurate. If a new problem present, please set up appointment sooner than planned today.

## 2023-12-10 LAB — URINE CYTOLOGY ANCILLARY ONLY
Chlamydia: NEGATIVE
Comment: NEGATIVE
Comment: NEGATIVE
Comment: NORMAL
Neisseria Gonorrhea: NEGATIVE
Trichomonas: NEGATIVE

## 2023-12-12 ENCOUNTER — Ambulatory Visit: Payer: Self-pay | Admitting: Family Medicine

## 2024-04-06 ENCOUNTER — Encounter: Payer: Self-pay | Admitting: Family Medicine

## 2024-04-06 ENCOUNTER — Ambulatory Visit: Admitting: Family Medicine

## 2024-04-06 VITALS — BP 124/80 | HR 95 | Temp 98.6°F | Resp 16 | Ht 63.0 in | Wt 183.0 lb

## 2024-04-06 DIAGNOSIS — J3089 Other allergic rhinitis: Secondary | ICD-10-CM | POA: Diagnosis not present

## 2024-04-06 DIAGNOSIS — J309 Allergic rhinitis, unspecified: Secondary | ICD-10-CM | POA: Insufficient documentation

## 2024-04-06 MED ORDER — FLUTICASONE PROPIONATE 50 MCG/ACT NA SUSP: 1.0000 | Freq: Two times a day (BID) | NASAL | 3 refills | Status: AC

## 2024-04-06 MED ORDER — MONTELUKAST SODIUM 10 MG PO TABS: 10.0000 mg | ORAL_TABLET | Freq: Every day | ORAL | 1 refills | Status: AC

## 2024-04-06 NOTE — Patient Instructions (Signed)
 A few things to remember from today's visit:  Allergic rhinitis due to other allergic trigger, unspecified seasonality - Plan: montelukast  (SINGULAIR ) 10 MG tablet, fluticasone (FLONASE) 50 MCG/ACT nasal spray  Resume Montelukast  10 mg at bedtime and take me during Fall and Spring. Zyrtec  10 mg in the morning. Flonase nasal spray daily as needed. Nasal saline irrigations as needed.  If problem does not improve we can consider referral to allergy  specialist to discuss allergy  shots.  If you need refills for medications you take chronically, please call your pharmacy. Do not use My Chart to request refills or for acute issues that need immediate attention. If you send a my chart message, it may take a few days to be addressed, specially if I am not in the office.  Please be sure medication list is accurate. If a new problem present, please set up appointment sooner than planned today.

## 2024-04-06 NOTE — Assessment & Plan Note (Addendum)
 It seems like she is still symptomatic even with pharmacologic treatment but tolerable. Recommend resuming montelukast  10 mg daily at bedtime and Zyrtec  10 mg daily. She can take montelukast  during seasons symptoms are worse, spring and fall. Flonase nasal spray daily as needed. We discussed some side effects of medications. Nasal irrigation as needed throughout the day. If symptoms have not better in 3 to 4 weeks, we can consider referral to immunologist.

## 2024-04-06 NOTE — Progress Notes (Signed)
 ACUTE VISIT Chief Complaint  Patient presents with   Medical Management of Chronic Issues   Discussed the use of AI scribe software for clinical note transcription with the patient, who gave verbal consent to proceed.  History of Present Illness Victoria Olson is a 33 year old female with past medical history significant for anxiety, prediabetes, vitamin D deficiency, and allergies who presents with allergy  symptoms exacerbated by outdoor activities.  She experiences burning sensations in her nose and throat, particularly when jogging in a park full of trees and when walking around her neighborhood and somebody is cutting the grass. These symptoms are accompanied by a clear rhinorrhea and nasal congestion after exercising, although she does not have symptoms before starting her activities.  Similar symptoms in intermittently for a few years, before 2015 when she started grad school. In the past she has taken montelukast  10 mg, cetirizine  10 mg (Zyrtec ), and a nasal spray for her allergies. She still had some symptoms while taking meds but tolerable. She stopped taking these medications as her symptoms improved when she started Plaquenil. However, her symptoms have worsened over the past few months since she began running and walking outdoors daily.  She describes additional symptoms of sinus and dental pain associated with the burning sensation. No cough, wheezing, fever, or chills.  He has been prescribed albuterol inhaler for wheezing during acute respiratory infections.  She has previously undergone allergy  testing in high school and college, which indicated a mold allergy .  She mentions that her daughter has been recommended for allergy  shots due to pollen and mold allergies..  Review of Systems  Constitutional:  Negative for activity change and appetite change.  HENT:  Negative for mouth sores and nosebleeds.   Respiratory:  Negative for chest tightness and shortness of breath.    Cardiovascular:  Negative for chest pain and leg swelling.  Gastrointestinal:  Negative for abdominal pain, nausea and vomiting.  Genitourinary:  Negative for decreased urine volume and hematuria.  Skin:  Negative for rash.  Allergic/Immunologic: Positive for environmental allergies.  Neurological:  Negative for syncope and facial asymmetry.  See other pertinent positives and negatives in HPI.  Current Outpatient Medications on File Prior to Visit  Medication Sig Dispense Refill   dicyclomine  (BENTYL ) 10 MG capsule Take 1 capsule (10 mg total) by mouth 3 (three) times daily before meals. 45 capsule 0   hydroxychloroquine (PLAQUENIL) 200 MG tablet Take 200 mg by mouth 2 (two) times daily.     phentermine (ADIPEX-P) 37.5 MG tablet Take 0.5 tablets by mouth daily before breakfast.     sertraline  (ZOLOFT ) 50 MG tablet Take 1 tablet (50 mg total) by mouth daily. 30 tablet 2   topiramate (TOPAMAX) 50 MG tablet Take 50 mg by mouth 2 (two) times daily.     No current facility-administered medications on file prior to visit.   Past Medical History:  Diagnosis Date   Allergic reaction    Anemia    Genital herpes    Hives    Hx of trichomonal vaginitis    Lupus 2019   Allergies  Allergen Reactions   Metronidazole  Nausea And Vomiting   Hydrocodone Hives    Social History   Socioeconomic History   Marital status: Single    Spouse name: Not on file   Number of children: Not on file   Years of education: Not on file   Highest education level: Not on file  Occupational History   Not on file  Tobacco Use   Smoking status: Never   Smokeless tobacco: Never  Vaping Use   Vaping status: Never Used  Substance and Sexual Activity   Alcohol use: Yes    Comment: occas   Drug use: No   Sexual activity: Yes    Birth control/protection: Pill  Other Topics Concern   Not on file  Social History Narrative   Are you right handed or left handed? right   Are you currently employed ? 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 With two kids   Caffeine none   What type of home do you live in: 1 story or 2 story? 1 story       Social Drivers of Corporate Investment Banker Strain: Low Risk  (08/12/2023)   Received from [facility]   Overall Financial Resource Strain (CARDIA)    Difficulty of Paying Living Expenses: Not hard at all  Food Insecurity: No Food Insecurity (08/12/2023)   Received from [facility]   Hunger Vital Sign    Within the past 12 months, you worried that your food would run out before you got the money to buy more.: Never true    Within the past 12 months, the food you bought just didn't last and you didn't have money to get more.: Never true  Transportation Needs: Unmet Transportation Needs (08/12/2023)   Received from [facility] - Transportation    Lack of Transportation (Medical): Yes    Lack of Transportation (Non-Medical): Yes  Physical Activity: Inactive (08/12/2023)   Received from [facility]   Exercise Vital Sign    On average, how many days per week do you engage in moderate to strenuous exercise (like a brisk walk)?: 0 days    On average, how many minutes do you engage in exercise at this level?: 20 min  Stress: Stress Concern Present (08/12/2023)   Received from [facility] - Occupational Stress Questionnaire    Feeling of Stress : Rather much  Social Connections: Socially Integrated (08/12/2023)   Received from [facility]   Social Network    How would you rate your social network (family, work, friends)?: Good participation with social networks   Vitals:   04/06/24 1537  BP: 124/80  Pulse: 95  Resp: 16  Temp: 98.6 F (37 C)  SpO2: 99%   Wt Readings from Last 3 Encounters:  04/06/24 183 lb (83 kg)  12/09/23 189 lb 6 oz (85.9 kg)  06/09/23 203 lb 8 oz (92.3 kg)   Body mass index is 32.42 kg/m.  Physical Exam Vitals  and nursing note reviewed.  Constitutional:      General: She is not in acute distress.    Appearance: She is well-developed. She is not ill-appearing.  HENT:     Head: Normocephalic and atraumatic.     Nose: Septal deviation present. No congestion or rhinorrhea.     Right Turbinates: Enlarged.     Left Turbinates: Enlarged.     Right Sinus: No maxillary sinus tenderness or frontal sinus tenderness.     Left Sinus: No maxillary sinus tenderness or frontal sinus tenderness.     Mouth/Throat:     Mouth: Mucous membranes are moist.     Pharynx: Oropharynx is clear.  Eyes:     Conjunctiva/sclera: Conjunctivae normal.  Cardiovascular:     Rate and Rhythm: Normal rate  and regular rhythm.     Heart sounds: No murmur heard. Pulmonary:     Effort: Pulmonary effort is normal. No respiratory distress.     Breath sounds: Normal breath sounds. No stridor.  Musculoskeletal:     Cervical back: No edema or erythema. No muscular tenderness.  Lymphadenopathy:     Head:     Right side of head: No submandibular adenopathy.     Left side of head: No submandibular adenopathy.     Cervical: No cervical adenopathy.  Skin:    General: Skin is warm.     Findings: No erythema or rash.  Neurological:     Mental Status: She is alert and oriented to person, place, and time.  Psychiatric:        Mood and Affect: Mood and affect normal.   ASSESSMENT AND PLAN:  Ms. Passon was seen today for respiratory symptoms suggestive of allergies.  Allergic rhinitis due to other allergic trigger, unspecified seasonality Assessment & Plan: It seems like she is still symptomatic even with pharmacologic treatment but tolerable. Recommend resuming montelukast  10 mg daily at bedtime and Zyrtec  10 mg daily. She can take montelukast  during seasons symptoms are worse, spring and fall. Flonase nasal spray daily as needed. We discussed some side effects of medications. Nasal irrigation as needed throughout the day. If  symptoms have not better in 3 to 4 weeks, we can consider referral to immunologist.  Orders: -     Montelukast  Sodium; Take 1 tablet (10 mg total) by mouth at bedtime.  Dispense: 90 tablet; Refill: 1 -     Fluticasone Propionate; Place 1 spray into both nostrils 2 (two) times daily.  Dispense: 16 g; Refill: 3   I personally spent a total of 30 minutes in the care of the patient today including preparing to see the patient, getting/reviewing separately obtained history, performing a medically appropriate exam/evaluation, counseling and educating, and documenting clinical information in the EHR.  Return if symptoms worsen or fail to improve, for keep next appointment.  Bubber Rothert G. Isatou Agredano, MD  [facility]. [place] office.
